# Patient Record
Sex: Male | Born: 1980 | Race: Black or African American | Hispanic: No | Marital: Married | Smoking: Never smoker
Health system: Southern US, Community
[De-identification: ages and names within clinical notes are randomized; demographics above are authoritative.]

## PROBLEM LIST (undated history)

## (undated) DIAGNOSIS — G40909 Epilepsy, unspecified, not intractable, without status epilepticus: Secondary | ICD-10-CM

## (undated) DIAGNOSIS — K56609 Unspecified intestinal obstruction, unspecified as to partial versus complete obstruction: Secondary | ICD-10-CM

## (undated) HISTORY — PX: EXPLORATORY LAPAROTOMY: SUR591

---

## 2016-05-22 DIAGNOSIS — Z0000 Encounter for general adult medical examination without abnormal findings: Secondary | ICD-10-CM | POA: Diagnosis not present

## 2016-05-24 DIAGNOSIS — M25562 Pain in left knee: Secondary | ICD-10-CM | POA: Diagnosis not present

## 2017-03-10 DIAGNOSIS — J208 Acute bronchitis due to other specified organisms: Secondary | ICD-10-CM | POA: Diagnosis not present

## 2017-03-10 DIAGNOSIS — H9201 Otalgia, right ear: Secondary | ICD-10-CM | POA: Diagnosis not present

## 2017-05-29 DIAGNOSIS — Z0000 Encounter for general adult medical examination without abnormal findings: Secondary | ICD-10-CM | POA: Diagnosis not present

## 2017-05-29 DIAGNOSIS — E7800 Pure hypercholesterolemia, unspecified: Secondary | ICD-10-CM | POA: Diagnosis not present

## 2017-05-29 DIAGNOSIS — Z79899 Other long term (current) drug therapy: Secondary | ICD-10-CM | POA: Diagnosis not present

## 2017-05-29 DIAGNOSIS — G40909 Epilepsy, unspecified, not intractable, without status epilepticus: Secondary | ICD-10-CM | POA: Diagnosis not present

## 2018-02-10 DIAGNOSIS — L821 Other seborrheic keratosis: Secondary | ICD-10-CM | POA: Diagnosis not present

## 2018-02-10 DIAGNOSIS — D239 Other benign neoplasm of skin, unspecified: Secondary | ICD-10-CM | POA: Diagnosis not present

## 2018-02-10 DIAGNOSIS — L309 Dermatitis, unspecified: Secondary | ICD-10-CM | POA: Diagnosis not present

## 2018-02-10 DIAGNOSIS — L739 Follicular disorder, unspecified: Secondary | ICD-10-CM | POA: Diagnosis not present

## 2018-03-30 DIAGNOSIS — L309 Dermatitis, unspecified: Secondary | ICD-10-CM | POA: Diagnosis not present

## 2018-06-02 DIAGNOSIS — L739 Follicular disorder, unspecified: Secondary | ICD-10-CM | POA: Diagnosis not present

## 2018-06-04 DIAGNOSIS — Z0000 Encounter for general adult medical examination without abnormal findings: Secondary | ICD-10-CM | POA: Diagnosis not present

## 2018-06-04 DIAGNOSIS — E7800 Pure hypercholesterolemia, unspecified: Secondary | ICD-10-CM | POA: Diagnosis not present

## 2018-06-04 DIAGNOSIS — G40909 Epilepsy, unspecified, not intractable, without status epilepticus: Secondary | ICD-10-CM | POA: Diagnosis not present

## 2018-06-04 DIAGNOSIS — Z5181 Encounter for therapeutic drug level monitoring: Secondary | ICD-10-CM | POA: Diagnosis not present

## 2018-12-07 ENCOUNTER — Other Ambulatory Visit: Payer: Self-pay

## 2018-12-07 DIAGNOSIS — Z20822 Contact with and (suspected) exposure to covid-19: Secondary | ICD-10-CM

## 2018-12-09 LAB — NOVEL CORONAVIRUS, NAA: SARS-CoV-2, NAA: NOT DETECTED

## 2019-02-16 ENCOUNTER — Other Ambulatory Visit: Payer: Self-pay

## 2019-02-16 DIAGNOSIS — Z20822 Contact with and (suspected) exposure to covid-19: Secondary | ICD-10-CM

## 2019-02-18 LAB — NOVEL CORONAVIRUS, NAA: SARS-CoV-2, NAA: NOT DETECTED

## 2019-03-09 ENCOUNTER — Other Ambulatory Visit: Payer: Self-pay

## 2019-03-09 DIAGNOSIS — Z20822 Contact with and (suspected) exposure to covid-19: Secondary | ICD-10-CM

## 2019-03-10 LAB — NOVEL CORONAVIRUS, NAA: SARS-CoV-2, NAA: NOT DETECTED

## 2019-05-27 DIAGNOSIS — Z5181 Encounter for therapeutic drug level monitoring: Secondary | ICD-10-CM | POA: Diagnosis not present

## 2019-05-27 DIAGNOSIS — E7800 Pure hypercholesterolemia, unspecified: Secondary | ICD-10-CM | POA: Diagnosis not present

## 2019-05-27 DIAGNOSIS — G40909 Epilepsy, unspecified, not intractable, without status epilepticus: Secondary | ICD-10-CM | POA: Diagnosis not present

## 2019-06-01 ENCOUNTER — Ambulatory Visit: Payer: Self-pay | Attending: Internal Medicine

## 2019-06-01 DIAGNOSIS — Z20822 Contact with and (suspected) exposure to covid-19: Secondary | ICD-10-CM | POA: Insufficient documentation

## 2019-06-02 ENCOUNTER — Other Ambulatory Visit: Payer: Self-pay

## 2019-06-02 LAB — NOVEL CORONAVIRUS, NAA: SARS-CoV-2, NAA: NOT DETECTED

## 2019-06-24 DIAGNOSIS — Z0000 Encounter for general adult medical examination without abnormal findings: Secondary | ICD-10-CM | POA: Diagnosis not present

## 2019-08-26 DIAGNOSIS — M79645 Pain in left finger(s): Secondary | ICD-10-CM | POA: Diagnosis not present

## 2019-08-26 DIAGNOSIS — R030 Elevated blood-pressure reading, without diagnosis of hypertension: Secondary | ICD-10-CM | POA: Diagnosis not present

## 2019-08-26 DIAGNOSIS — E7800 Pure hypercholesterolemia, unspecified: Secondary | ICD-10-CM | POA: Diagnosis not present

## 2019-08-26 DIAGNOSIS — R635 Abnormal weight gain: Secondary | ICD-10-CM | POA: Diagnosis not present

## 2019-11-30 DIAGNOSIS — E7800 Pure hypercholesterolemia, unspecified: Secondary | ICD-10-CM | POA: Diagnosis not present

## 2019-12-03 DIAGNOSIS — E7800 Pure hypercholesterolemia, unspecified: Secondary | ICD-10-CM | POA: Diagnosis not present

## 2019-12-03 DIAGNOSIS — R030 Elevated blood-pressure reading, without diagnosis of hypertension: Secondary | ICD-10-CM | POA: Diagnosis not present

## 2019-12-03 DIAGNOSIS — G40909 Epilepsy, unspecified, not intractable, without status epilepticus: Secondary | ICD-10-CM | POA: Diagnosis not present

## 2019-12-03 DIAGNOSIS — R635 Abnormal weight gain: Secondary | ICD-10-CM | POA: Diagnosis not present

## 2020-04-21 DIAGNOSIS — U071 COVID-19: Secondary | ICD-10-CM | POA: Diagnosis not present

## 2020-04-21 DIAGNOSIS — Z20822 Contact with and (suspected) exposure to covid-19: Secondary | ICD-10-CM | POA: Diagnosis not present

## 2020-05-03 DIAGNOSIS — Z1159 Encounter for screening for other viral diseases: Secondary | ICD-10-CM | POA: Diagnosis not present

## 2020-05-17 DIAGNOSIS — Z1159 Encounter for screening for other viral diseases: Secondary | ICD-10-CM | POA: Diagnosis not present

## 2020-05-18 DIAGNOSIS — Z1159 Encounter for screening for other viral diseases: Secondary | ICD-10-CM | POA: Diagnosis not present

## 2020-05-18 DIAGNOSIS — Z1152 Encounter for screening for COVID-19: Secondary | ICD-10-CM | POA: Diagnosis not present

## 2020-06-15 DIAGNOSIS — Z1159 Encounter for screening for other viral diseases: Secondary | ICD-10-CM | POA: Diagnosis not present

## 2020-07-18 DIAGNOSIS — E7800 Pure hypercholesterolemia, unspecified: Secondary | ICD-10-CM | POA: Diagnosis not present

## 2020-07-18 DIAGNOSIS — Z125 Encounter for screening for malignant neoplasm of prostate: Secondary | ICD-10-CM | POA: Diagnosis not present

## 2020-07-18 DIAGNOSIS — Z5181 Encounter for therapeutic drug level monitoring: Secondary | ICD-10-CM | POA: Diagnosis not present

## 2020-07-24 DIAGNOSIS — Z0000 Encounter for general adult medical examination without abnormal findings: Secondary | ICD-10-CM | POA: Diagnosis not present

## 2020-09-08 DIAGNOSIS — Z5181 Encounter for therapeutic drug level monitoring: Secondary | ICD-10-CM | POA: Diagnosis not present

## 2020-09-23 ENCOUNTER — Other Ambulatory Visit: Payer: Self-pay

## 2020-09-23 ENCOUNTER — Emergency Department (HOSPITAL_COMMUNITY)
Admission: EM | Admit: 2020-09-23 | Discharge: 2020-09-24 | Disposition: A | Discharge status: Home / Self Care | Payer: BC Managed Care – PPO | Attending: Emergency Medicine | Admitting: Emergency Medicine

## 2020-09-23 DIAGNOSIS — S0101XA Laceration without foreign body of scalp, initial encounter: Secondary | ICD-10-CM | POA: Diagnosis not present

## 2020-09-23 DIAGNOSIS — W228XXA Striking against or struck by other objects, initial encounter: Secondary | ICD-10-CM | POA: Diagnosis not present

## 2020-09-23 DIAGNOSIS — S0990XA Unspecified injury of head, initial encounter: Secondary | ICD-10-CM | POA: Diagnosis not present
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
## 2020-09-24 ENCOUNTER — Emergency Department (HOSPITAL_COMMUNITY): Payer: BC Managed Care – PPO

## 2020-09-24 DIAGNOSIS — S0101XA Laceration without foreign body of scalp, initial encounter: Secondary | ICD-10-CM | POA: Diagnosis not present

## 2020-09-24 DIAGNOSIS — S0990XA Unspecified injury of head, initial encounter: Secondary | ICD-10-CM | POA: Diagnosis not present

## 2020-09-24 MED ORDER — LIDOCAINE-EPINEPHRINE (PF) 2 %-1:200000 IJ SOLN
10.0000 mL | Freq: Once | INTRAMUSCULAR | Status: AC
  Administered 2020-09-24: 10 mL
  Filled 2020-09-24: qty 20

## 2020-09-24 NOTE — ED Notes (Signed)
Suture cart at bedside 

## 2020-12-27 DIAGNOSIS — L040 Acute lymphadenitis of face, head and neck: Secondary | ICD-10-CM | POA: Diagnosis not present

## 2021-02-09 DIAGNOSIS — E7800 Pure hypercholesterolemia, unspecified: Secondary | ICD-10-CM | POA: Diagnosis not present

## 2021-02-26 DIAGNOSIS — Z5181 Encounter for therapeutic drug level monitoring: Secondary | ICD-10-CM | POA: Diagnosis not present

## 2021-02-26 DIAGNOSIS — R899 Unspecified abnormal finding in specimens from other organs, systems and tissues: Secondary | ICD-10-CM | POA: Diagnosis not present

## 2021-02-26 DIAGNOSIS — E7800 Pure hypercholesterolemia, unspecified: Secondary | ICD-10-CM | POA: Diagnosis not present

## 2021-02-26 DIAGNOSIS — G40909 Epilepsy, unspecified, not intractable, without status epilepticus: Secondary | ICD-10-CM | POA: Diagnosis not present

## 2021-04-04 DIAGNOSIS — R1032 Left lower quadrant pain: Secondary | ICD-10-CM | POA: Diagnosis not present

## 2021-05-08 DIAGNOSIS — B353 Tinea pedis: Secondary | ICD-10-CM | POA: Diagnosis not present

## 2021-05-30 DIAGNOSIS — Z412 Encounter for routine and ritual male circumcision: Secondary | ICD-10-CM | POA: Diagnosis not present

## 2021-06-19 ENCOUNTER — Other Ambulatory Visit: Payer: Self-pay

## 2021-06-19 ENCOUNTER — Observation Stay (HOSPITAL_COMMUNITY): Payer: BC Managed Care – PPO

## 2021-06-19 ENCOUNTER — Encounter (HOSPITAL_COMMUNITY): Payer: Self-pay

## 2021-06-19 ENCOUNTER — Inpatient Hospital Stay (HOSPITAL_COMMUNITY)
Admission: EM | Admit: 2021-06-19 | Discharge: 2021-06-21 | DRG: 375 | Disposition: A | Discharge status: Home / Self Care | Payer: BC Managed Care – PPO | Attending: Internal Medicine | Admitting: Internal Medicine

## 2021-06-19 ENCOUNTER — Emergency Department (HOSPITAL_COMMUNITY): Payer: BC Managed Care – PPO

## 2021-06-19 DIAGNOSIS — R1110 Vomiting, unspecified: Secondary | ICD-10-CM | POA: Diagnosis not present

## 2021-06-19 DIAGNOSIS — G40909 Epilepsy, unspecified, not intractable, without status epilepticus: Secondary | ICD-10-CM | POA: Diagnosis not present

## 2021-06-19 DIAGNOSIS — R935 Abnormal findings on diagnostic imaging of other abdominal regions, including retroperitoneum: Secondary | ICD-10-CM | POA: Diagnosis present

## 2021-06-19 DIAGNOSIS — E871 Hypo-osmolality and hyponatremia: Secondary | ICD-10-CM | POA: Diagnosis not present

## 2021-06-19 DIAGNOSIS — R109 Unspecified abdominal pain: Secondary | ICD-10-CM | POA: Diagnosis not present

## 2021-06-19 DIAGNOSIS — Z8249 Family history of ischemic heart disease and other diseases of the circulatory system: Secondary | ICD-10-CM | POA: Diagnosis not present

## 2021-06-19 DIAGNOSIS — Z6830 Body mass index (BMI) 30.0-30.9, adult: Secondary | ICD-10-CM | POA: Diagnosis not present

## 2021-06-19 DIAGNOSIS — K5669 Other partial intestinal obstruction: Secondary | ICD-10-CM | POA: Diagnosis not present

## 2021-06-19 DIAGNOSIS — E860 Dehydration: Secondary | ICD-10-CM | POA: Diagnosis not present

## 2021-06-19 DIAGNOSIS — E669 Obesity, unspecified: Secondary | ICD-10-CM | POA: Diagnosis not present

## 2021-06-19 DIAGNOSIS — R739 Hyperglycemia, unspecified: Secondary | ICD-10-CM | POA: Diagnosis not present

## 2021-06-19 DIAGNOSIS — R1013 Epigastric pain: Secondary | ICD-10-CM | POA: Diagnosis not present

## 2021-06-19 DIAGNOSIS — Z803 Family history of malignant neoplasm of breast: Secondary | ICD-10-CM | POA: Diagnosis not present

## 2021-06-19 DIAGNOSIS — K56609 Unspecified intestinal obstruction, unspecified as to partial versus complete obstruction: Secondary | ICD-10-CM | POA: Diagnosis present

## 2021-06-19 DIAGNOSIS — C786 Secondary malignant neoplasm of retroperitoneum and peritoneum: Principal | ICD-10-CM | POA: Diagnosis present

## 2021-06-19 DIAGNOSIS — K6389 Other specified diseases of intestine: Secondary | ICD-10-CM | POA: Diagnosis not present

## 2021-06-19 DIAGNOSIS — Z20822 Contact with and (suspected) exposure to covid-19: Secondary | ICD-10-CM | POA: Diagnosis not present

## 2021-06-19 DIAGNOSIS — R918 Other nonspecific abnormal finding of lung field: Secondary | ICD-10-CM | POA: Diagnosis not present

## 2021-06-19 HISTORY — DX: Epilepsy, unspecified, not intractable, without status epilepticus: G40.909

## 2021-06-19 HISTORY — DX: Unspecified intestinal obstruction, unspecified as to partial versus complete obstruction: K56.609

## 2021-06-19 LAB — CBC WITH DIFFERENTIAL/PLATELET
Abs Immature Granulocytes: 0.02 10*3/uL (ref 0.00–0.07)
Basophils Absolute: 0 10*3/uL (ref 0.0–0.1)
Basophils Relative: 0 %
Eosinophils Absolute: 0.1 10*3/uL (ref 0.0–0.5)
Eosinophils Relative: 1 %
HCT: 42.3 % (ref 39.0–52.0)
Hemoglobin: 14.1 g/dL (ref 13.0–17.0)
Immature Granulocytes: 0 %
Lymphocytes Relative: 28 %
Lymphs Abs: 2.3 10*3/uL (ref 0.7–4.0)
MCH: 27.8 pg (ref 26.0–34.0)
MCHC: 33.3 g/dL (ref 30.0–36.0)
MCV: 83.3 fL (ref 80.0–100.0)
Monocytes Absolute: 0.5 10*3/uL (ref 0.1–1.0)
Monocytes Relative: 7 %
Neutro Abs: 5.3 10*3/uL (ref 1.7–7.7)
Neutrophils Relative %: 64 %
Platelets: 260 10*3/uL (ref 150–400)
RBC: 5.08 MIL/uL (ref 4.22–5.81)
RDW: 13.1 % (ref 11.5–15.5)
WBC: 8.3 10*3/uL (ref 4.0–10.5)
nRBC: 0 % (ref 0.0–0.2)

## 2021-06-19 LAB — COMPREHENSIVE METABOLIC PANEL
ALT: 32 U/L (ref 0–44)
AST: 39 U/L (ref 15–41)
Albumin: 4.5 g/dL (ref 3.5–5.0)
Alkaline Phosphatase: 120 U/L (ref 38–126)
Anion gap: 12 (ref 5–15)
BUN: 12 mg/dL (ref 6–20)
CO2: 26 mmol/L (ref 22–32)
Calcium: 9.7 mg/dL (ref 8.9–10.3)
Chloride: 96 mmol/L — ABNORMAL LOW (ref 98–111)
Creatinine, Ser: 1.17 mg/dL (ref 0.61–1.24)
GFR, Estimated: >60 mL/min (ref >60)
Glucose, Bld: 165 mg/dL — ABNORMAL HIGH (ref 70–99)
Potassium: 4.1 mmol/L (ref 3.5–5.1)
Sodium: 134 mmol/L — ABNORMAL LOW (ref 135–145)
Total Bilirubin: 0.5 mg/dL (ref 0.3–1.2)
Total Protein: 8.8 g/dL — ABNORMAL HIGH (ref 6.5–8.1)

## 2021-06-19 LAB — URINALYSIS, ROUTINE W REFLEX MICROSCOPIC
Glucose, UA: NEGATIVE mg/dL
Hgb urine dipstick: NEGATIVE
Ketones, ur: 15 mg/dL — AB
Leukocytes,Ua: NEGATIVE
Nitrite: NEGATIVE
Protein, ur: 100 mg/dL — AB
Specific Gravity, Urine: 1.01 (ref 1.005–1.030)
pH: 6.5 (ref 5.0–8.0)

## 2021-06-19 LAB — URINALYSIS, MICROSCOPIC (REFLEX)
Bacteria, UA: NONE SEEN
RBC / HPF: NONE SEEN RBC/hpf (ref 0–5)
Squamous Epithelial / HPF: NONE SEEN (ref 0–5)

## 2021-06-19 LAB — RESP PANEL BY RT-PCR (FLU A&B, COVID) ARPGX2
Influenza A by PCR: NEGATIVE
Influenza B by PCR: NEGATIVE
SARS Coronavirus 2 by RT PCR: NEGATIVE

## 2021-06-19 LAB — LIPASE, BLOOD: Lipase: 42 U/L (ref 11–51)

## 2021-06-19 MED ORDER — DIATRIZOATE MEGLUMINE & SODIUM 66-10 % PO SOLN
90.0000 mL | Freq: Once | ORAL | Status: AC
  Administered 2021-06-19: 90 mL via ORAL
  Filled 2021-06-19: qty 90

## 2021-06-19 MED ORDER — SODIUM CHLORIDE 0.9 % IV SOLN: INTRAVENOUS | Status: DC

## 2021-06-19 MED ORDER — ACETAMINOPHEN 325 MG PO TABS: 650.0000 mg | ORAL_TABLET | Freq: Four times a day (QID) | ORAL | Status: DC | PRN

## 2021-06-19 MED ORDER — ENOXAPARIN SODIUM 40 MG/0.4ML IJ SOSY
40.0000 mg | PREFILLED_SYRINGE | Freq: Every day | INTRAMUSCULAR | Status: DC
  Administered 2021-06-19 – 2021-06-21 (×3): 40 mg via SUBCUTANEOUS
  Filled 2021-06-19 (×3): qty 0.4

## 2021-06-19 MED ORDER — IOHEXOL 300 MG/ML  SOLN
100.0000 mL | Freq: Once | INTRAMUSCULAR | Status: AC | PRN
  Administered 2021-06-19: 100 mL via INTRAVENOUS

## 2021-06-19 MED ORDER — ALBUTEROL SULFATE (2.5 MG/3ML) 0.083% IN NEBU: 2.5000 mg | INHALATION_SOLUTION | Freq: Four times a day (QID) | RESPIRATORY_TRACT | Status: DC | PRN

## 2021-06-19 MED ORDER — ONDANSETRON HCL 4 MG/2ML IJ SOLN: 4.0000 mg | Freq: Four times a day (QID) | INTRAMUSCULAR | Status: DC | PRN

## 2021-06-19 MED ORDER — SODIUM CHLORIDE 0.9 % IV BOLUS
1000.0000 mL | Freq: Once | INTRAVENOUS | Status: AC
  Administered 2021-06-19: 1000 mL via INTRAVENOUS

## 2021-06-19 MED ORDER — CARBAMAZEPINE ER 400 MG PO TB12
400.0000 mg | ORAL_TABLET | Freq: Two times a day (BID) | ORAL | Status: DC
  Administered 2021-06-20 – 2021-06-21 (×3): 400 mg via ORAL
  Filled 2021-06-19 (×3): qty 1

## 2021-06-19 MED ORDER — CARBAMAZEPINE ER 200 MG PO TB12: 400.0000 mg | ORAL_TABLET | Freq: Two times a day (BID) | ORAL | Status: DC

## 2021-06-19 MED ORDER — ACETAMINOPHEN 650 MG RE SUPP: 650.0000 mg | Freq: Four times a day (QID) | RECTAL | Status: DC | PRN

## 2021-06-19 MED ORDER — MORPHINE SULFATE (PF) 2 MG/ML IV SOLN: 2.0000 mg | INTRAVENOUS | Status: DC | PRN

## 2021-06-19 MED ORDER — ONDANSETRON HCL 4 MG/2ML IJ SOLN
4.0000 mg | Freq: Once | INTRAMUSCULAR | Status: AC
  Administered 2021-06-19: 4 mg via INTRAVENOUS
  Filled 2021-06-19: qty 2

## 2021-06-19 MED ORDER — PANTOPRAZOLE SODIUM 40 MG IV SOLR
40.0000 mg | INTRAVENOUS | Status: DC
  Administered 2021-06-20: 40 mg via INTRAVENOUS
  Filled 2021-06-19: qty 10

## 2021-06-19 MED ORDER — SODIUM CHLORIDE 0.9% FLUSH
3.0000 mL | Freq: Two times a day (BID) | INTRAVENOUS | Status: DC
  Administered 2021-06-19 – 2021-06-21 (×2): 3 mL via INTRAVENOUS

## 2021-06-19 MED ORDER — ONDANSETRON HCL 4 MG PO TABS: 4.0000 mg | ORAL_TABLET | Freq: Four times a day (QID) | ORAL | Status: DC | PRN

## 2021-06-19 NOTE — H&P (Signed)
History and Physical    Patient: Kyle Sharp LZJ:[id number] DOB: [birth year] DOA: 06/19/2021 DOS: the patient was seen and examined on 06/19/2021 PCP: Vernie Shanks, MD  Patient coming from: Home  Chief Complaint:  Chief Complaint  Patient presents with   Abdominal Pain    HPI: Kyle Sharp is a 41 y.o. male with medical history significant of seizure disorder and SBO s/p exploratory surgeries at the age 25 and 59 presents with complaints of left upper quadrant abdominal pain which started yesterday afternoon.  Pain seemed to radiate to left lower abdomen on the left side.  Pain initially came in waves, but became constant and more severe last night and immediately came to the emergency department.  Patient has daily had similar pain over the last year in December after which he took stool softeners and symptoms seem to improve.  He reported having some kink in his bowels as a kid that he had to have surgery at the age of 1.  He reported having several bowel obstructions where he had to come into the hospital frequently and have NG tube placed  until the obstruction would resolve.  This occurred until age of around 71  where he had his second abdominal surgery which seem to clear up the issue until here recently.  Of note he did have a colonoscopy around the age of 60 for bleeding from, but it was thought to do something with patient having constipation.  He denies having any recent fevers, weight loss, or chest pain.  In the ED patient was noted to be afebrile blood pressure was elevated up to 142/109, and all other vital signs maintained.  Labs noted sodium 134, chloride 96, and glucose 165.  While in triage patient had 1 episode of vomiting.  CT scan of the abdomen pelvis noted multiple well-defined collections of loculated fluid or soft tissue throughout the peritoneal cavity of unclear pathology and small bowel obstruction with transition point in the region of the jejunum.  Urinalysis noted  protein and ketones without significant signs of infection.  General surgery have been consulted.  Patient was given 1 L normal saline IV fluids and Zofran.  No NG tube was initially placed as patient was actively vomiting.  Review of Systems: As mentioned in the history of present illness. All other systems reviewed and are negative. Past Medical History:  Diagnosis Date   Seizure disorder [facility])    Past Surgical History:  Procedure Laterality Date   EXPLORATORY LAPAROTOMY     At the age of 1 and at the age of 28   Social History:  reports that he has never smoked. He has never used smokeless tobacco. He reports current alcohol use. No history on file for drug use.  No Known Allergies  Family History  Problem Relation Age of Onset   Heart failure Mother    Breast cancer Paternal Aunt    Breast cancer Paternal Aunt     Prior to Admission medications   Not on File    Physical Exam: Vitals:   06/19/21 0729 06/19/21 0937 06/19/21 0947 06/19/21 1030  BP: (!) 142/109 (!) 136/93  125/75  Pulse: 94 83  85  Resp: 17 16  15   Temp: 98.2 F (36.8 C)  99 F (37.2 C)   TempSrc: Oral Oral Oral   SpO2: 99% 99%  99%    Constitutional: Middle-age male currently in NAD, calm, comfortable Eyes: PERRL, lids and conjunctivae normal ENMT: Mucous membranes are moist. Posterior  pharynx clear of any exudate or lesions.  Neck: normal, supple, no masses, no thyromegaly Respiratory: clear to auscultation bilaterally, no wheezing, no crackles. Normal respiratory effort. No accessory muscle use.  Cardiovascular: Regular rate and rhythm, no murmurs / rubs / gallops. No extremity edema. 2+ pedal pulses. No carotid bruits.  Abdomen: Midline scar present with bowel sounds present in all 4 quadrants.  Mild tenderness noted of the left upper quadrant. Musculoskeletal: no clubbing / cyanosis. Normal muscle tone.  Skin: no rashes, lesions, ulcers. No induration Neurologic: CN 2-12 grossly intact. Strength  5/5 in all 4.  Psychiatric: Normal judgment and insight. Alert and oriented x 3. Normal mood.    Data Reviewed: Labs and imaging reviewed.  Assessment and Plan: * SBO (small bowel obstruction) ([place])- (present on admission) Patient presents with complaints of abdominal pain.  Prior history of surgeries CT scan concerning for small bowel obstruction with transition point in the region of the distal jejunum. -Admit to a MedSurg bed -Small bowel protocol  -N.p.o. -Normal saline IV fluids at 75 mL/h -Protonix IV -Morphine IV as needed for pain -Antiemetics as needed -Follow-up repeat abdominal x-ray -Appreciate general surgery consultative services.  Abnormal CT of the abdomen- (present on admission) Acute.  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 -General surgery recommended IR consultation for biopsy -Consider discussing with oncology in regards to other diagnostic test that should be ordered in a.m.  Seizure disorder [facility]) Patient with a prior history of seizure disorder.  Last seizure noted to be back in 2014.  Currently on Tegretol extended release 400 mg twice daily. -Continue Tegretol.  Patient to use home medication  Hyponatremia Acute.  Sodium is mildly low at 134.  Likely related with recent episode of vomiting. -IV fluids as noted above -Recheck sodium in am   Hyperglycemia Acute.  Glucose 165 on admission.  No prior history of diabetes.  Could be secondary to acute stress -Continue to monitor and consider need to check hemoglobin A1c       Advance Care Planning:   Code Status: Full Code   Consults: General surgery  Family Communication: Discussed plan of care with the patient's wife  Severity of Illness: The appropriate patient status for this patient is INPATIENT. Inpatient  status is judged to be reasonable and necessary in order to provide the required intensity of service to ensure the patient's safety. The patient's presenting symptoms, physical exam findings, and initial radiographic and laboratory data in the context of their chronic comorbidities is felt to place them at high risk for further clinical deterioration. Furthermore, it is not anticipated that the patient will be medically stable for discharge from the hospital within 2 midnights of admission.   * I certify that at the point of admission it is my clinical judgment that the patient will require inpatient hospital care spanning beyond 2 midnights from the point of admission due to high intensity of service, high risk for further deterioration and high frequency of surveillance required.*  Author: Norval Morton, MD 06/19/2021 10:56 AM  For on call review [email or web address].

## 2021-06-19 NOTE — Assessment & Plan Note (Addendum)
Patient with previous history of abdominal surgeries and small bowel obstruction.  General surgery was consulted.  Seems to be improving.  He has had multiple bowel movements.  Imaging study showed that contrast was noted to be in the colon.  Diet was advanced gradually.  Patient did well without any recurrence of nausea or vomiting.  Has been ambulating without difficulty.  Cleared by general surgery for discharge.

## 2021-06-19 NOTE — Consult Note (Signed)
Consult/Admission Note  Kyle Sharp [birth year]  [id number].    Requesting MD: Isla Pence, MD Chief Complaint/Reason for Consult: SBO possible peritoneal carcinomatosis  HPI:  Patient is a 41 year old male who presented to the ED with intermittent abdominal pain dating back to December 2022.  This episode of pain started yesterday at 3 PM.  Pain is located in the left upper quadrant and radiates down his left side. The patient states this is typical of his abdominal pain and is where it always hurts, however the pain usually goes away on its own after he has a bowel movement or passes gas.  Reports several BMs yesterday, not dark or bloody.  States his last episode of flatus was yesterday.  Reports 1 episode of nonbloody vomiting in the emergency department.  Denies fever, chills, urinary complaints, chest pain, SOB.  He reports a history of constipation that started in adulthood-for this he usually takes MiraLAX, prune juice, occasionally magnesium citrate.  No known hx of malignancy -states he had a colonoscopy in [place] around the age of 46 or 36 due to GI bleeding that was negative.  Has not had a colonoscopy since that time.  Denies recent weight loss.  Past abdominal surgeries include surgery for a bowel obstruction when he was 55-1/42 years old, followed by exploratory laparotomy when he was 14 due to recurrent small bowel obstructions.  Patient reports a family history of breast cancer but denies a known family history of colon cancer.  PMH otherwise significant for epilepsy with last seizure in 2014. Surgical history as stated above. NKDA.  Denies tobacco or drug use.  Reports occasional, but not daily, alcohol use.  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  ROS: As above Review of Systems  All other systems reviewed and are negative.  No family history on file.  History reviewed. No pertinent past medical  history.  History reviewed. No pertinent surgical history.  Social History:  has no history on file for tobacco use, alcohol use, and drug use.  Allergies: No Known Allergies  (Not in a hospital admission)   Blood pressure (!) 136/93, pulse 83, temperature 99 F (37.2 C), temperature source Oral, resp. rate 16, SpO2 99 %. Physical Exam:  General: pleasant, WD,  male who is laying in bed in NAD HEENT: head is normocephalic, atraumatic.  Sclera are noninjected.  PERRL.  Ears and nose without any masses or lesions.  Mouth is pink and moist Heart: regular, rate, and rhythm.  Normal s1,s2. No obvious murmurs, gallops, or rubs noted.  Palpable radial and pedal pulses bilaterally Lungs: CTAB, no wheezes, rhonchi, or rales noted.  Respiratory effort nonlabored Abd: soft, nontender, not significantly distended, +BS, no masses, hernias, or organomegaly; previous midline laparotomy scar appears well-healing, previous transverse abdominal incision from surgery as a child; there are no palpable masses or lymph nodes on abdominal exam MS: all 4 extremities are symmetrical with no cyanosis, clubbing, or edema. Skin: warm and dry with no masses, lesions, or rashes Neuro: Cranial nerves 2-12 grossly intact, sensation is normal throughout Psych: A&Ox3 with an appropriate affect.   Results for orders placed or performed during the hospital encounter of 06/19/21 (from the past 48 hour(s))  CBC with Differential     Status: None   Collection Time: 06/19/21  2:34 AM  Result Value Ref Range   WBC 8.3 4.0 - 10.5 K/uL  RBC 5.08 4.22 - 5.81 MIL/uL   Hemoglobin 14.1 13.0 - 17.0 g/dL   HCT 42.3 39.0 - 52.0 %   MCV 83.3 80.0 - 100.0 fL   MCH 27.8 26.0 - 34.0 pg   MCHC 33.3 30.0 - 36.0 g/dL   RDW 13.1 11.5 - 15.5 %   Platelets 260 150 - 400 K/uL   nRBC 0.0 0.0 - 0.2 %   Neutrophils Relative % 64 %   Neutro Abs 5.3 1.7 - 7.7 K/uL   Lymphocytes Relative 28 %   Lymphs Abs 2.3 0.7 - 4.0 K/uL   Monocytes  Relative 7 %   Monocytes Absolute 0.5 0.1 - 1.0 K/uL   Eosinophils Relative 1 %   Eosinophils Absolute 0.1 0.0 - 0.5 K/uL   Basophils Relative 0 %   Basophils Absolute 0.0 0.0 - 0.1 K/uL   Immature Granulocytes 0 %   Abs Immature Granulocytes 0.02 0.00 - 0.07 K/uL    Comment: Performed at [facility] [address] [place], [place] [address]  Comprehensive metabolic panel     Status: Abnormal   Collection Time: 06/19/21  2:34 AM  Result Value Ref Range   Sodium 134 (L) 135 - 145 mmol/L   Potassium 4.1 3.5 - 5.1 mmol/L   Chloride 96 (L) 98 - 111 mmol/L   CO2 26 22 - 32 mmol/L   Glucose, Bld 165 (H) 70 - 99 mg/dL    Comment: Glucose reference range applies only to samples taken after fasting for at least 8 hours.   BUN 12 6 - 20 mg/dL   Creatinine, Ser 1.17 0.61 - 1.24 mg/dL   Calcium 9.7 8.9 - 10.3 mg/dL   Total Protein 8.8 (H) 6.5 - 8.1 g/dL   Albumin 4.5 3.5 - 5.0 g/dL   AST 39 15 - 41 U/L   ALT 32 0 - 44 U/L   Alkaline Phosphatase 120 38 - 126 U/L   Total Bilirubin 0.5 0.3 - 1.2 mg/dL   GFR, Estimated >60 >60 mL/min    Comment: (NOTE) Calculated using the CKD-EPI Creatinine Equation (2021)    Anion gap 12 5 - 15    Comment: Performed at [facility] [address], [place], [place] [address]  Lipase, blood     Status: None   Collection Time: 06/19/21  2:34 AM  Result Value Ref Range   Lipase 42 11 - 51 U/L    Comment: Performed at [facility] [address] [place], [place] [address]  Urinalysis, Routine w reflex microscopic     Status: Abnormal   Collection Time: 06/19/21  9:18 AM  Result Value Ref Range   Color, Urine AMBER (A) YELLOW    Comment: BIOCHEMICALS MAY BE AFFECTED BY COLOR   APPearance CLEAR CLEAR   Specific Gravity, Urine 1.010 1.005 - 1.030   pH 6.5 5.0 - 8.0   Glucose, UA NEGATIVE NEGATIVE mg/dL   Hgb urine dipstick NEGATIVE NEGATIVE   Bilirubin Urine SMALL (A) NEGATIVE   Ketones, ur 15 (A) NEGATIVE mg/dL   Protein, ur 100  (A) NEGATIVE mg/dL   Nitrite NEGATIVE NEGATIVE   Leukocytes,Ua NEGATIVE NEGATIVE    Comment: Performed at [facility] [address], [place], [place] [address]  Urinalysis, Microscopic (reflex)     Status: None   Collection Time: 06/19/21  9:18 AM  Result Value Ref Range   RBC / HPF NONE SEEN 0 - 5 RBC/hpf   WBC,  UA 0-5 0 - 5 WBC/hpf   Bacteria, UA NONE SEEN NONE SEEN   Squamous Epithelial / LPF NONE SEEN 0 - 5    Comment: Performed at [facility], [place] [address], [place], [place] [address]   CT ABDOMEN PELVIS W CONTRAST  Result Date: 06/19/2021 CLINICAL DATA:  Nausea vomiting with abdominal pain. EXAM: CT ABDOMEN AND PELVIS WITH CONTRAST TECHNIQUE: Multidetector CT imaging of the abdomen and pelvis was performed using the standard protocol following bolus administration of intravenous contrast. RADIATION DOSE REDUCTION: This exam was performed according to the departmental dose-optimization program which includes automated exposure control, adjustment of the mA and/or kV according to patient size and/or use of iterative reconstruction technique. CONTRAST:  146mL OMNIPAQUE IOHEXOL 300 MG/ML  SOLN COMPARISON:  None. FINDINGS: Lower chest: Unremarkable. Hepatobiliary: No suspicious focal abnormality within the liver parenchyma. There is no evidence for gallstones, gallbladder wall thickening, or pericholecystic fluid. No intrahepatic or extrahepatic biliary dilation. Pancreas: No focal mass lesion. No dilatation of the main duct. No intraparenchymal cyst. No peripancreatic edema. Spleen: No splenomegaly. No focal mass lesion. Adrenals/Urinary Tract: No adrenal nodule or mass. Kidneys unremarkable. No evidence for hydroureter. The urinary bladder appears normal for the degree of distention. Stomach/Bowel: Stomach is unremarkable. No gastric wall thickening. No evidence of outlet obstruction. Duodenum is normally positioned as is the ligament of Treitz. Small bowel in the left upper  quadrant and central abdomen is dilated and fluid-filled measuring up to 4.8 cm diameter. Fecalization of enteric contents noted in the left abdominal loop (axial image 49/3) with rapid tapering into a nondilated loop (axial 47/3) this appears to be in the region of the distal jejunum. Ileal loops are decompressed. Terminal ileum unremarkable. The appendix is normal. Colon is diffusely decompressed. Vascular/Lymphatic: No abdominal aortic aneurysm. No abdominal aortic atherosclerotic calcification. There is no gastrohepatic or hepatoduodenal ligament lymphadenopathy. No retroperitoneal or mesenteric lymphadenopathy. No pelvic sidewall lymphadenopathy. Reproductive: The prostate gland and seminal vesicles are unremarkable. Other: Multiple well-defined homogeneous collections of loculated fluid or low-density soft tissue are identified throughout the peritoneal cavity. 4.2 x 3.1 cm lesion identified in the lesser sac on image 19/series 3. 4.7 x 1.7 cm collection identified anterior to the liver on image 11/series 3. 3.7 x 1.8 cm collection identified anterior to the inferior vena cava on 43/3. Several omental collections are evident. Material is identified in the left paracolic gutter (images 62-83 of series 3. Small volume free fluid noted in the posterior pelvis. Musculoskeletal: No worrisome lytic or sclerotic osseous abnormality. IMPRESSION: 1. Multiple well-defined homogeneous collections of loculated fluid or low-density soft tissue throughout the peritoneal cavity and in the anterior right juxta. Imaging features are nonspecific but raise the question of peritoneal carcinomatosis. PET-CT may prove helpful to further evaluate. Ultimately, tissue sampling likely warranted. No appendix is visible on the study in the patient may be status post appendectomy. If so, correlation with pathology report from appendectomy suggested. 2. Small bowel obstruction with transition point likely in the region of the distal  jejunum. 3. Small volume free fluid in the posterior pelvis. Electronically Signed   By: Misty Stanley M.D.   On: 06/19/2021 06:22    Assessment/Plan SBO Possible peritoneal carcinomatosis - CT 2/28 with well defined collections of loculated fluid of low-density soft tissue throughout peritoneal cavity and in anterior right juxta, question of peritoneal carcinomatosis, SBO w/ transition likely in the distal jejunum  - no leukocytosis, afebrile - abdominal exam benign, no further vomiting, stomach  decompressed on his CT scan - recommend medical admission and PO SBO protocol, would place an NG tube if abdominal distention, vomiting, or abdominal pain recur. - recommend IR consult for biopsy of collections vs soft tissue masses/LN  FEN-NPO, IVF VTE-SCDs, okay for chemical DVT prophylaxis from a surgical perspective ID- afebrile, WBC WNL, there are intra-abdominal fluid collections present but I dont feel strongly that IV abx are currently indicated  Admit to medical service  Hx of epilepsy with last seizure in 2014  I reviewed nursing notes, ED provider notes, hospitalist notes, last 24 h vitals and pain scores, last 48 h intake and output, last 24 h labs and trends, and last 24 h imaging results.  This care required high  level of medical decision making.   Obie Dredge, [facility] 06/19/2021, 10:26 AM Please see Amion for pager number during day hours 7:00am-4:30pm

## 2021-06-19 NOTE — ED Provider Triage Note (Addendum)
Emergency Medicine Provider Triage Evaluation Note  Kyle Sharp , a 41 y.o. male  was evaluated in triage.  Pt complains of epigastric abdominal pain.  Issues of the past few weeks with heartburn and constipation.  Seen at another ED recently and had CT done that was normal aside from constipation, discharged home with MiraLAX.  Has been trying Senokot and other medications at home but cannot have bowel movement.  No vomiting  Review of Systems  Positive: Abdominal pain Negative: Vomiting, fever  Physical Exam  BP (!) 130/96 (BP Location: Right Arm)    Pulse 92    Temp 98.5 F (36.9 C) (Oral)    Resp 20    SpO2 100%   Gen:   Awake, no distress   Resp:  Normal effort  MSK:   Moves extremities without difficulty  Other:  Well healed abdominal surgical scars from childhood, no apparent distention, some mild tenderness left lower  Medical Decision Making  Medically screening exam initiated at 2:38 AM.  Appropriate orders placed.  Silvano Garofano was informed that the remainder of the evaluation will be completed by another provider, this initial triage assessment does not replace that evaluation, and the importance of remaining in the ED until their evaluation is complete.  Abdominal pain.  Recent ED visits with CT showing constipation.  No vomiting.  Labs, acute abd series.  2:48 AM Patient now vomiting-- large volume.  Will get CT scan to r/o obstruction.   Larene Pickett, PA-C 06/19/21 0240    Larene Pickett, PA-C 06/19/21 0242    Larene Pickett, PA-C 06/19/21 [phone]

## 2021-06-19 NOTE — Assessment & Plan Note (Addendum)
Apart from small bowel obstruction there were also findings suggestive and concerning for peritoneal carcinomatosis.  CT abdomen pelvis did not show any mass in the pancreas or in the stomach.  No lesions in the hepatobiliary system also noted. Interventional radiology was consulted.  CT chest was done which did not show any new findings.  Interventional radiology felt that there was no safe way to biopsy these lesions.  Discussed with general surgery who also did not feel that there was any surgical we did biopsy these lesions.   Discussed with medical oncology.  Patient will be scheduled an appointment at their rapid diagnosis clinic.   May need to undergo PET CT scan.  Patient was also requested to obtain old CT imagings done while he was in [place] for comparison.   The plan was discussed with patient and his wife and they agree. Tumor markers including CEA, CA 19-9 and PSA were normal.

## 2021-06-19 NOTE — Assessment & Plan Note (Addendum)
Elevated glucose level of 165 was nonfasting.  Fasting levels have been normal.  No further inpatient work-up is necessary at this time.

## 2021-06-19 NOTE — Progress Notes (Signed)
IR procedure request lymph node biopsy.    History of epilepsy. Presented to the ED at [facility] with abdominal pain X 3 weeks and 1 episode of vomiting. CT Abd pelvis from 2.28.23 reads  Multiple well-defined homogeneous collections of loculated fluid or low-density soft tissue throughout the peritoneal cavity and in the anterior right juxta. Imaging features are nonspecific but raise the question of peritoneal carcinomatosis. PET-CT may prove helpful to further evaluate. Ultimately, tissue sampling likely warranted.   Request discussed with Mir. Recommend management and treatment of emergent bowel obstruction. Once the patient has stabilized obtain enhanced CT chest for further devaluation of possible biopsy sites. Please obtain any outside films for comparison if applicable.  Please re-consult IR after Patient's acute condition is stabilized and enhanced CT Chest obtained.

## 2021-06-19 NOTE — Assessment & Plan Note (Addendum)
Patient with a prior history of seizure disorder.  Last seizure noted to be back in 2014.  Currently on Tegretol extended release 400 mg twice daily.

## 2021-06-19 NOTE — ED Notes (Addendum)
Gastrografin finished by pt. Xray notified.

## 2021-06-19 NOTE — Assessment & Plan Note (Addendum)
Sodium level has improved.

## 2021-06-19 NOTE — ED Triage Notes (Signed)
Pt has been having upper abd pain for the few weeks, has problems in the past with constipation, no n/v

## 2021-06-19 NOTE — ED Provider Notes (Signed)
[facility] Provider Note   CSN: [id number] Arrival date & time: 06/19/21  1275     History  Chief Complaint  Patient presents with   Abdominal Pain    Kyle Sharp is a 41 y.o. male with a history of epilepsy last seizure in 2014 who presents to the ED due to intermittent upper abdominal pain x3 weeks.  Patient evaluated at urgent care 3 weeks ago where a KUB was performed which showed possible constipation per patient.  Patient treated with MiraLAX and omeprazole with no relief.  Patient states pain is located in the epigastric and left side of abdomen.  Patient endorses 1 episode of nonbloody, nonbilious emesis while in the waiting room today.  He endorses multiple bowel movements yesterday without any melena or hematochezia.  He has had multiple abdominal operations when he was 2-1/2 and 14 in [place] however, unsure exactly what was performed.  No fever or chills.  No urinary symptoms.  Denies chest pain and shortness of breath. Denies history of cancer. No recent weight loss.       Home Medications Prior to Admission medications   Not on File      Allergies    Patient has no known allergies.    Review of Systems   Review of Systems  Constitutional:  Negative for chills and fever.  Respiratory:  Negative for shortness of breath.   Cardiovascular:  Negative for chest pain.  Gastrointestinal:  Positive for abdominal pain, nausea and vomiting. Negative for diarrhea.  Genitourinary:  Negative for dysuria.  Musculoskeletal:  Negative for back pain.   Physical Exam Updated Vital Signs BP 125/75    Pulse 85    Temp 99 F (37.2 C) (Oral)    Resp 15    SpO2 99%  Physical Exam Vitals and nursing note reviewed.  Constitutional:      General: He is not in acute distress.    Appearance: He is not ill-appearing.  HENT:     Head: Normocephalic.  Eyes:     Pupils: Pupils are equal, round, and reactive to light.  Cardiovascular:     Rate  and Rhythm: Normal rate and regular rhythm.     Pulses: Normal pulses.     Heart sounds: Normal heart sounds. No murmur heard.   No friction rub. No gallop.  Pulmonary:     Effort: Pulmonary effort is normal.     Breath sounds: Normal breath sounds.  Abdominal:     General: Abdomen is flat. There is no distension.     Palpations: Abdomen is soft.     Tenderness: There is abdominal tenderness. There is no guarding or rebound.     Comments: Mild diffuse tenderness without rebound or guarding. Old surgical scars.   Musculoskeletal:        General: Normal range of motion.     Cervical back: Neck supple.  Skin:    General: Skin is warm and dry.  Neurological:     General: No focal deficit present.     Mental Status: He is alert.  Psychiatric:        Mood and Affect: Mood normal.        Behavior: Behavior normal.    ED Results / Procedures / Treatments   Labs (all labs ordered are listed, but only abnormal results are displayed) Labs Reviewed  COMPREHENSIVE METABOLIC PANEL - Abnormal; Notable for the following components:      Result Value   Sodium 134 (*)  Chloride 96 (*)    Glucose, Bld 165 (*)    Total Protein 8.8 (*)    All other components within normal limits  URINALYSIS, ROUTINE W REFLEX MICROSCOPIC - Abnormal; Notable for the following components:   Color, Urine AMBER (*)    Bilirubin Urine SMALL (*)    Ketones, ur 15 (*)    Protein, ur 100 (*)    All other components within normal limits  RESP PANEL BY RT-PCR (FLU A&B, COVID) ARPGX2  CBC WITH DIFFERENTIAL/PLATELET  LIPASE, BLOOD  URINALYSIS, MICROSCOPIC (REFLEX)    EKG None  Radiology CT ABDOMEN PELVIS W CONTRAST  Result Date: 06/19/2021 CLINICAL DATA:  Nausea vomiting with abdominal pain. EXAM: CT ABDOMEN AND PELVIS WITH CONTRAST TECHNIQUE: Multidetector CT imaging of the abdomen and pelvis was performed using the standard protocol following bolus administration of intravenous contrast. RADIATION DOSE  REDUCTION: This exam was performed according to the departmental dose-optimization program which includes automated exposure control, adjustment of the mA and/or kV according to patient size and/or use of iterative reconstruction technique. CONTRAST:  148mL OMNIPAQUE IOHEXOL 300 MG/ML  SOLN COMPARISON:  None. FINDINGS: Lower chest: Unremarkable. Hepatobiliary: No suspicious focal abnormality within the liver parenchyma. There is no evidence for gallstones, gallbladder wall thickening, or pericholecystic fluid. No intrahepatic or extrahepatic biliary dilation. Pancreas: No focal mass lesion. No dilatation of the main duct. No intraparenchymal cyst. No peripancreatic edema. Spleen: No splenomegaly. No focal mass lesion. Adrenals/Urinary Tract: No adrenal nodule or mass. Kidneys unremarkable. No evidence for hydroureter. The urinary bladder appears normal for the degree of distention. Stomach/Bowel: Stomach is unremarkable. No gastric wall thickening. No evidence of outlet obstruction. Duodenum is normally positioned as is the ligament of Treitz. Small bowel in the left upper quadrant and central abdomen is dilated and fluid-filled measuring up to 4.8 cm diameter. Fecalization of enteric contents noted in the left abdominal loop (axial image 49/3) with rapid tapering into a nondilated loop (axial 47/3) this appears to be in the region of the distal jejunum. Ileal loops are decompressed. Terminal ileum unremarkable. The appendix is normal. Colon is diffusely decompressed. Vascular/Lymphatic: No abdominal aortic aneurysm. No abdominal aortic atherosclerotic calcification. There is no gastrohepatic or hepatoduodenal ligament lymphadenopathy. No retroperitoneal or mesenteric lymphadenopathy. No pelvic sidewall lymphadenopathy. Reproductive: The prostate gland and seminal vesicles are unremarkable. Other: Multiple well-defined homogeneous collections of loculated fluid or low-density soft tissue are identified throughout  the peritoneal cavity. 4.2 x 3.1 cm lesion identified in the lesser sac on image 19/series 3. 4.7 x 1.7 cm collection identified anterior to the liver on image 11/series 3. 3.7 x 1.8 cm collection identified anterior to the inferior vena cava on 43/3. Several omental collections are evident. Material is identified in the left paracolic gutter (images 97-35 of series 3. Small volume free fluid noted in the posterior pelvis. Musculoskeletal: No worrisome lytic or sclerotic osseous abnormality. IMPRESSION: 1. Multiple well-defined homogeneous collections of loculated fluid or low-density soft tissue throughout the peritoneal cavity and in the anterior right juxta. Imaging features are nonspecific but raise the question of peritoneal carcinomatosis. PET-CT may prove helpful to further evaluate. Ultimately, tissue sampling likely warranted. No appendix is visible on the study in the patient may be status post appendectomy. If so, correlation with pathology report from appendectomy suggested. 2. Small bowel obstruction with transition point likely in the region of the distal jejunum. 3. Small volume free fluid in the posterior pelvis. Electronically Signed   By: Verda Cumins.D.  On: 06/19/2021 06:22    Procedures Procedures    Medications Ordered in ED Medications  ondansetron (ZOFRAN) injection 4 mg (4 mg Intravenous Given 06/19/21 0254)  iohexol (OMNIPAQUE) 300 MG/ML solution 100 mL (100 mLs Intravenous Contrast Given 06/19/21 0447)  sodium chloride 0.9 % bolus 1,000 mL (1,000 mLs Intravenous New Bag/Given 06/19/21 1010)    ED Course/ Medical Decision Making/ A&P                           Medical Decision Making Amount and/or Complexity of Data Reviewed Independent Historian: spouse    Details: wife at bedside provided some history Labs: ordered. Decision-making details documented in ED Course. Radiology: ordered and independent interpretation performed. Decision-making details documented in ED  Course.  Risk Decision regarding hospitalization.   41 year old male presents to the ED due to intermittent abdominal pain x3 weeks.  Patient has had numerous abdominal operations when he was a child in [place]; however, unsure exactly what was performed.  Patient endorses 1 episode of nonbloody, nonbilious emesis while in the waiting room.  Patient evaluated at urgent care 3 weeks ago and diagnosed with constipation which she has been taking MiraLAX and omeprazole with no relief.  Upon arrival, stable vitals.  Patient in no acute distress.  Unfortunately, patient waited over 7 hours prior to my initial evaluation due to long wait times.  Abdomen soft, nondistended with diffuse tenderness.  No rebound or guarding.  Routine labs and CT abdomen ordered at triage.  Patient declined pain medication at this time. IVFs given.  CBC unremarkable.  No leukocytosis and normal hemoglobin.  Lipase normal at 41.  Doubt pancreatitis.  CMP significant for mild hyponatremia 134.  Hyperglycemia 165 without evidence of DKA.  UA significant for small amount of bilirubin, ketonuria, and proteinuria likely due to dehydration. CT abdomen personally visualized which demonstrates multiple well-defined collections of loculated fluid which raises question for peritoneal carcinomatosis. It also shows a small bowel obstruction. Patient not actively vomiting during intial evaluation. Will hold off on NG tube at this time. Will discuss CT results with surgery. Patient will require admission for bowel obstruction. COVID test ordered.   10:24 AM Discussed with surgery PA who will evaluate patient at bedside.   10:57 AM Discussed with Dr. Tamala Julian with [facility] who agrees to admit patient.   Co morbidities that complicate the patient evaluation  Numerous previous abdominal operations   Critical Interventions:  IVFs, held off on NG given no active vomiting  Reevaluation:  After the interventions noted above, I reevaluated the  patient and found that they have :stayed the same         Final Clinical Impression(s) / ED Diagnoses Final diagnoses:  Small bowel obstruction [facility])    Rx / DC Orders ED Discharge Orders     None         Karie Kirks 06/19/21 1101    Isla Pence, MD 06/19/21 1443

## 2021-06-20 ENCOUNTER — Observation Stay (HOSPITAL_COMMUNITY): Payer: BC Managed Care – PPO

## 2021-06-20 DIAGNOSIS — R935 Abnormal findings on diagnostic imaging of other abdominal regions, including retroperitoneum: Secondary | ICD-10-CM | POA: Diagnosis not present

## 2021-06-20 DIAGNOSIS — K56609 Unspecified intestinal obstruction, unspecified as to partial versus complete obstruction: Secondary | ICD-10-CM | POA: Diagnosis not present

## 2021-06-20 DIAGNOSIS — G40909 Epilepsy, unspecified, not intractable, without status epilepticus: Secondary | ICD-10-CM | POA: Diagnosis not present

## 2021-06-20 DIAGNOSIS — R918 Other nonspecific abnormal finding of lung field: Secondary | ICD-10-CM | POA: Diagnosis not present

## 2021-06-20 LAB — BASIC METABOLIC PANEL
Anion gap: 7 (ref 5–15)
BUN: 15 mg/dL (ref 6–20)
CO2: 26 mmol/L (ref 22–32)
Calcium: 8.6 mg/dL — ABNORMAL LOW (ref 8.9–10.3)
Chloride: 104 mmol/L (ref 98–111)
Creatinine, Ser: 1.05 mg/dL (ref 0.61–1.24)
GFR, Estimated: >60 mL/min (ref >60)
Glucose, Bld: 81 mg/dL (ref 70–99)
Potassium: 3.7 mmol/L (ref 3.5–5.1)
Sodium: 137 mmol/L (ref 135–145)

## 2021-06-20 LAB — CBC
HCT: 38 % — ABNORMAL LOW (ref 39.0–52.0)
Hemoglobin: 12.7 g/dL — ABNORMAL LOW (ref 13.0–17.0)
MCH: 28 pg (ref 26.0–34.0)
MCHC: 33.4 g/dL (ref 30.0–36.0)
MCV: 83.7 fL (ref 80.0–100.0)
Platelets: 199 10*3/uL (ref 150–400)
RBC: 4.54 MIL/uL (ref 4.22–5.81)
RDW: 13.2 % (ref 11.5–15.5)
WBC: 3.6 10*3/uL — ABNORMAL LOW (ref 4.0–10.5)
nRBC: 0 % (ref 0.0–0.2)

## 2021-06-20 LAB — MAGNESIUM: Magnesium: 2.1 mg/dL (ref 1.7–2.4)

## 2021-06-20 MED ORDER — IOHEXOL 300 MG/ML  SOLN
75.0000 mL | Freq: Once | INTRAMUSCULAR | Status: AC | PRN
  Administered 2021-06-20: 75 mL via INTRAVENOUS

## 2021-06-20 NOTE — Progress Notes (Signed)
? ?Progress Note ? ?   ?Subjective: ?Pt reports having a BM overnight. Denies nausea or vomiting. He has a lot of questions regarding fluid collections vs soft tissue densities this AM, we discussed that we will know more once IR able to biopsy/sample these.  ? ?Objective: ?Vital signs in last 24 hours: ?Temp:  [98 ?F (36.7 ?C)-99 ?F (37.2 ?C)] 98.6 ?F (37 ?C) (03/01 0731) ?Pulse Rate:  [51-87] 64 (03/01 0731) ?Resp:  [15-18] 16 (03/01 0731) ?BP: (100-143)/(70-93) 123/79 (03/01 0731) ?SpO2:  [97 %-100 %] 98 % (03/01 0731) ?Last BM Date : 06/19/21 ? ?Intake/Output from previous day: ?02/28 0701 - 03/01 0700 ?In: 1534.4 [I.V.:534.4; IV Piggyback:1000] ?Out: -  ?Intake/Output this shift: ?No intake/output data recorded. ? ?PE: ?General: pleasant, WD, WN male who is laying in bed in NAD ?Heart: regular, rate, and rhythm.   ?Lungs: Respiratory effort nonlabored ?Abd: soft, NT, ND, +BS, midline and lower transverse surgical scars well healed ?MS: all 4 extremities are symmetrical with no cyanosis, clubbing, or edema. ?Skin: warm and dry with no masses, lesions, or rashes ?Psych: A&Ox3 with an appropriate affect.  ? ? ?Lab Results:  ?Recent Labs  ?  06/19/21 ?0234 06/20/21 ?0226  ?WBC 8.3 3.6*  ?HGB 14.1 12.7*  ?HCT 42.3 38.0*  ?PLT 260 199  ? ?BMET ?Recent Labs  ?  06/19/21 ?0234 06/20/21 ?0226  ?NA 134* 137  ?K 4.1 3.7  ?CL 96* 104  ?CO2 26 26  ?GLUCOSE 165* 81  ?BUN 12 15  ?CREATININE 1.17 1.05  ?CALCIUM 9.7 8.6*  ? ?PT/INR ?No results for input(s): LABPROT, INR in the last 72 hours. ?CMP  ?   ?Component Value Date/Time  ? NA 137 06/20/2021 0226  ? K 3.7 06/20/2021 0226  ? CL 104 06/20/2021 0226  ? CO2 26 06/20/2021 0226  ? GLUCOSE 81 06/20/2021 0226  ? BUN 15 06/20/2021 0226  ? CREATININE 1.05 06/20/2021 0226  ? CALCIUM 8.6 (L) 06/20/2021 0226  ? PROT 8.8 (H) 06/19/2021 0234  ? ALBUMIN 4.5 06/19/2021 0234  ? AST 39 06/19/2021 0234  ? ALT 32 06/19/2021 0234  ? ALKPHOS 120 06/19/2021 0234  ? BILITOT 0.5 06/19/2021 0234   ? GFRNONAA >60 06/20/2021 0226  ? ?Lipase  ?   ?Component Value Date/Time  ? LIPASE 42 06/19/2021 0234  ? ? ? ? ? ?Studies/Results: ?CT ABDOMEN PELVIS W CONTRAST ? ?Result Date: 06/19/2021 ?CLINICAL DATA:  Nausea vomiting with abdominal pain. EXAM: CT ABDOMEN AND PELVIS WITH CONTRAST TECHNIQUE: Multidetector CT imaging of the abdomen and pelvis was performed using the standard protocol following bolus administration of intravenous contrast. RADIATION DOSE REDUCTION: This exam was performed according to the departmental dose-optimization program which includes automated exposure control, adjustment of the mA and/or kV according to patient size and/or use of iterative reconstruction technique. CONTRAST:  177m OMNIPAQUE IOHEXOL 300 MG/ML  SOLN COMPARISON:  None. FINDINGS: Lower chest: Unremarkable. Hepatobiliary: No suspicious focal abnormality within the liver parenchyma. There is no evidence for gallstones, gallbladder wall thickening, or pericholecystic fluid. No intrahepatic or extrahepatic biliary dilation. Pancreas: No focal mass lesion. No dilatation of the main duct. No intraparenchymal cyst. No peripancreatic edema. Spleen: No splenomegaly. No focal mass lesion. Adrenals/Urinary Tract: No adrenal nodule or mass. Kidneys unremarkable. No evidence for hydroureter. The urinary bladder appears normal for the degree of distention. Stomach/Bowel: Stomach is unremarkable. No gastric wall thickening. No evidence of outlet obstruction. Duodenum is normally positioned as is the ligament of Treitz. Small bowel  in the left upper quadrant and central abdomen is dilated and fluid-filled measuring up to 4.8 cm diameter. Fecalization of enteric contents noted in the left abdominal loop (axial image 49/3) with rapid tapering into a nondilated loop (axial 47/3) this appears to be in the region of the distal jejunum. Ileal loops are decompressed. Terminal ileum unremarkable. The appendix is normal. Colon is diffusely  decompressed. Vascular/Lymphatic: No abdominal aortic aneurysm. No abdominal aortic atherosclerotic calcification. There is no gastrohepatic or hepatoduodenal ligament lymphadenopathy. No retroperitoneal or mesenteric lymphadenopathy. No pelvic sidewall lymphadenopathy. Reproductive: The prostate gland and seminal vesicles are unremarkable. Other: Multiple well-defined homogeneous collections of loculated fluid or low-density soft tissue are identified throughout the peritoneal cavity. 4.2 x 3.1 cm lesion identified in the lesser sac on image 19/series 3. 4.7 x 1.7 cm collection identified anterior to the liver on image 11/series 3. 3.7 x 1.8 cm collection identified anterior to the inferior vena cava on 43/3. Several omental collections are evident. Material is identified in the left paracolic gutter (images 91-79 of series 3. Small volume free fluid noted in the posterior pelvis. Musculoskeletal: No worrisome lytic or sclerotic osseous abnormality. IMPRESSION: 1. Multiple well-defined homogeneous collections of loculated fluid or low-density soft tissue throughout the peritoneal cavity and in the anterior right juxta. Imaging features are nonspecific but raise the question of peritoneal carcinomatosis. PET-CT may prove helpful to further evaluate. Ultimately, tissue sampling likely warranted. No appendix is visible on the study in the patient may be status post appendectomy. If so, correlation with pathology report from appendectomy suggested. 2. Small bowel obstruction with transition point likely in the region of the distal jejunum. 3. Small volume free fluid in the posterior pelvis. Electronically Signed   By: Misty Stanley M.D.   On: 06/19/2021 06:22  ? ?DG Abd Portable 1V-Small Bowel Obstruction Protocol-initial, 8 hr delay ? ?Result Date: 06/19/2021 ?CLINICAL DATA:  Small-bowel obstruction EXAM: PORTABLE ABDOMEN - 1 VIEW COMPARISON:  06/19/2021 FINDINGS: Two supine frontal views of the abdomen and pelvis  demonstrate oral contrast throughout the colon. Persistent nonspecific gaseous distention of the small bowel within the central abdomen, decreasing caliber since prior CT. No abdominal masses. No acute bony abnormalities. IMPRESSION: 1. Transit of oral contrast into the colon, excluding high-grade obstruction. 2. Nonspecific gaseous distention of the small bowel within the central abdomen, decreased in caliber since recent CT. Electronically Signed   By: Randa Ngo M.D.   On: 06/19/2021 22:46   ? ?Anti-infectives: ?Anti-infectives (From admission, onward)  ? ? None  ? ?  ? ? ? ?Assessment/Plan ?SBO ?Possible peritoneal carcinomatosis ?- CT 2/28 with well defined collections of loculated fluid of low-density soft tissue throughout peritoneal cavity and in anterior right juxta, question of peritoneal carcinomatosis, SBO w/ transition likely in the distal jejunum  ?- no leukocytosis, afebrile ?- abdominal exam benign, no further vomiting, stomach decompressed on his CT scan ?- got PO gastrografin yesterday and contrast cleared to colon on 8h delay film, pt having bowel function  ?- ok to start CLD today ?- IR wanted patient to be better from SBO standpoint prior to consideration of any biopsy - I think he has currently met this requirement. Also recommending CT chest, primary attending to order today ?- no indication for emergent surgery  ?  ?FEN - CLD, IVF @ 75 cc/h ?VTE - SCDs, LMWH ?ID - afebrile, WBC WNL, there are intra-abdominal fluid collections present but I dont feel strongly that IV abx are currently indicated ?  ?  Hx of epilepsy with last seizure in 2014 ? LOS: 0 days  ? ?I reviewed Consultant IR notes, hospitalist notes, last 24 h vitals and pain scores, last 48 h intake and output, last 24 h labs and trends, and last 24 h imaging results. ? ?This care required moderate level of medical decision making.  ? ? ?Norm Parcel, PA-C ?[facility] ?06/20/2021, 9:19 AM ?Please see Amion for pager  number during day hours 7:00am-4:30pm ? ?

## 2021-06-20 NOTE — Hospital Course (Addendum)
41 y.o. male with medical history significant of seizure disorder and SBO s/p exploratory surgeries at the age 41 and 32 presented with complaints of left upper quadrant abdominal pain.  CT scan raise concern for small bowel obstruction.  CT scan also showed multiple ill-defined collections of loculated fluid of soft tissue throughout the peritoneal cavity.   ?

## 2021-06-20 NOTE — Progress Notes (Signed)
Interventional Radiology Brief Note: ? ?IR consulted for possible biopsy of loculate fluid vs. Soft tissue masses in the peritoneum.  Case reviewed by Dr. Serafina Royals who notes that the areas/masses in question are not amenable for percutaneous biopsy.  If further concern for malignancy, recommend PET CT.  ? ?Ordering team notified via secure chat.  ? ?Brynda Greathouse, MS RD PA-C ? ? ?

## 2021-06-20 NOTE — Progress Notes (Signed)
[facility] PROGRESS NOTE   Kyle Sharp IWL:[id number] DOB: 1981-02-28 DOA: 06/19/2021  0 DOS: the patient was seen and examined on 06/20/2021  PCP: Vernie Shanks, MD  Brief History and Hospital Course:  41 y.o. male with medical history significant of seizure disorder and SBO s/p exploratory surgeries at the age 59 and 89 presented with complaints of left upper quadrant abdominal pain.  CT scan raise concern for small bowel obstruction.  CT scan also showed multiple ill-defined collections of loculated fluid of soft tissue throughout the peritoneal cavity.  General surgery was consulted.  IR was consulted for biopsy.  Consultants: General surgery.  Interventional radiology  Procedures: None yet    Subjective: Patient feels better.  Has had bowel movements.  Denies any nausea vomiting.  Anxious about the findings on the CT scan.    Assessment/Plan:   * SBO (small bowel obstruction) ([place])- (present on admission) Patient with previous history of abdominal surgeries and small bowel obstruction.  General surgery was consulted.  Seems to be improving.  He has had multiple bowel movements.  Imaging study showed that contrast was noted to be in the colon.  Abdomen is soft.  Diet to be advanced gradually.  Abnormal CT of the abdomen- (present on admission) Apart from small bowel obstruction there were also findings suggestive and concerning for peritoneal carcinomatosis.  CT abdomen pelvis did not show any mass in the pancreas or in the stomach.  No lesions in the hepatobiliary system also noted. Interventional radiology was consulted.  They are requesting a CT of the chest before pursuing biopsy. This will be ordered. We will check CEA, CA 19-9.   Seizure disorder [facility]) Patient with a prior history of seizure disorder.  Last seizure noted to be back in 2014.  Currently on Tegretol extended release 400 mg twice daily.  Hyponatremia Sodium level has improved.  Continue to  monitor periodically.    Hyperglycemia This was a nonfasting level.  Noted to be 81 this morning.  Outpatient monitoring.     Obesity Estimated body mass index is 30.81 kg/m as calculated from the following:   Height as of 09/23/20: 6\' 2"  (1.88 m).   Weight as of 09/23/20: 108.9 kg.   DVT Prophylaxis: Lovenox Code Status: Full code Family Communication: Discussed with patient Disposition Plan: Hopefully return home in improved  Status is: Observation The patient will require care spanning > 2 midnights and should be moved to inpatient because: Small bowel obstruction      Medications: Scheduled:  carbamazepine  400 mg Oral BID   enoxaparin (LOVENOX) injection  40 mg Subcutaneous Daily   pantoprazole (PROTONIX) IV  40 mg Intravenous Q24H   sodium chloride flush  3 mL Intravenous Q12H   Continuous:  sodium chloride 75 mL/hr at 06/19/21 1352   RDE:YCXKGYJEHUDJS **OR** acetaminophen, albuterol, morphine injection, ondansetron **OR** ondansetron (ZOFRAN) IV  Antibiotics: Anti-infectives (From admission, onward)    None       Objective:  Vital Signs  Vitals:   06/19/21 2000 06/19/21 2112 06/20/21 0523 06/20/21 0731  BP: 138/84 130/84 100/70 123/79  Pulse: 63 63 (!) 51 64  Resp: 16 17 18 16   Temp: 98.5 F (36.9 C) 98.6 F (37 C) 98 F (36.7 C) 98.6 F (37 C)  TempSrc:  Oral  Oral  SpO2: 97% 98% 99% 98%    Intake/Output Summary (Last 24 hours) at 06/20/2021 1128 Last data filed at 06/19/2021 2230 Gross per 24 hour  Intake 1534.42 ml  Output --  Net 1534.42 ml   There were no vitals filed for this visit.  General appearance: Awake alert.  In no distress Resp: Clear to auscultation bilaterally.  Normal effort Cardio: S1-S2 is normal regular.  No S3-S4.  No rubs murmurs or bruit GI: Abdomen is soft.  Nontender nondistended.  Bowel sounds are present normal.  No masses organomegaly Extremities: No edema.  Full range of motion of lower extremities. Neurologic:  Alert and oriented x3.  No focal neurological deficits.    Lab Results:  Data Reviewed: I have personally reviewed labs and imaging study reports  CBC: Recent Labs  Lab 06/19/21 0234 06/20/21 0226  WBC 8.3 3.6*  NEUTROABS 5.3  --   HGB 14.1 12.7*  HCT 42.3 38.0*  MCV 83.3 83.7  PLT 260 277    Basic Metabolic Panel: Recent Labs  Lab 06/19/21 0234 06/20/21 0226  NA 134* 137  K 4.1 3.7  CL 96* 104  CO2 26 26  GLUCOSE 165* 81  BUN 12 15  CREATININE 1.17 1.05  CALCIUM 9.7 8.6*  MG  --  2.1    GFR: CrCl cannot be calculated (Unknown ideal weight.).  Liver Function Tests: Recent Labs  Lab 06/19/21 0234  AST 39  ALT 32  ALKPHOS 120  BILITOT 0.5  PROT 8.8*  ALBUMIN 4.5    Recent Labs  Lab 06/19/21 0234  LIPASE 42    Recent Results (from the past 240 hour(s))  Resp Panel by RT-PCR (Flu A&B, Covid) Nasopharyngeal Swab     Status: None   Collection Time: 06/19/21 10:43 AM   Specimen: Nasopharyngeal Swab; Nasopharyngeal(NP) swabs in vial transport medium  Result Value Ref Range Status   SARS Coronavirus 2 by RT PCR NEGATIVE NEGATIVE Final    Comment: (NOTE) SARS-CoV-2 target nucleic acids are NOT DETECTED.  The SARS-CoV-2 RNA is generally detectable in upper respiratory specimens during the acute phase of infection. The lowest concentration of SARS-CoV-2 viral copies this assay can detect is 138 copies/mL. A negative result does not preclude SARS-Cov-2 infection and should not be used as the sole basis for treatment or other patient management decisions. A negative result may occur with  improper specimen collection/handling, submission of specimen other than nasopharyngeal swab, presence of viral mutation(s) within the areas targeted by this assay, and inadequate number of viral copies(<138 copies/mL). A negative result must be combined with clinical observations, patient history, and epidemiological information. The expected result is  Negative.  Fact Sheet for Patients:  EntrepreneurPulse.com.au  Fact Sheet for Healthcare Providers:  IncredibleEmployment.be  This test is no t yet approved or cleared by the [place] FDA and  has been authorized for detection and/or diagnosis of SARS-CoV-2 by FDA under an Emergency Use Authorization (EUA). This EUA will remain  in effect (meaning this test can be used) for the duration of the COVID-19 declaration under Section 564(b)(1) of the Act, 21 U.S.C.section 360bbb-3(b)(1), unless the authorization is terminated  or revoked sooner.       Influenza A by PCR NEGATIVE NEGATIVE Final   Influenza B by PCR NEGATIVE NEGATIVE Final    Comment: (NOTE) The Xpert Xpress SARS-CoV-2/FLU/RSV plus assay is intended as an aid in the diagnosis of influenza from Nasopharyngeal swab specimens and should not be used as a sole basis for treatment. Nasal washings and aspirates are unacceptable for Xpert Xpress SARS-CoV-2/FLU/RSV testing.  Fact Sheet for Patients: EntrepreneurPulse.com.au  Fact Sheet for Healthcare Providers: IncredibleEmployment.be  This test is not yet  approved or cleared by the [place] and has been authorized for detection and/or diagnosis of SARS-CoV-2 by FDA under an Emergency Use Authorization (EUA). This EUA will remain in effect (meaning this test can be used) for the duration of the COVID-19 declaration under Section 564(b)(1) of the Act, 21 U.S.C. section 360bbb-3(b)(1), unless the authorization is terminated or revoked.  Performed at [facility], [place] [address], [place], [place] [address]       Radiology Studies: CT ABDOMEN PELVIS W CONTRAST  Result Date: 06/19/2021 CLINICAL DATA:  Nausea vomiting with abdominal pain. EXAM: CT ABDOMEN AND PELVIS WITH CONTRAST TECHNIQUE: Multidetector CT imaging of the abdomen and pelvis was performed using the standard protocol  following bolus administration of intravenous contrast. RADIATION DOSE REDUCTION: This exam was performed according to the departmental dose-optimization program which includes automated exposure control, adjustment of the mA and/or kV according to patient size and/or use of iterative reconstruction technique. CONTRAST:  189mL OMNIPAQUE IOHEXOL 300 MG/ML  SOLN COMPARISON:  None. FINDINGS: Lower chest: Unremarkable. Hepatobiliary: No suspicious focal abnormality within the liver parenchyma. There is no evidence for gallstones, gallbladder wall thickening, or pericholecystic fluid. No intrahepatic or extrahepatic biliary dilation. Pancreas: No focal mass lesion. No dilatation of the main duct. No intraparenchymal cyst. No peripancreatic edema. Spleen: No splenomegaly. No focal mass lesion. Adrenals/Urinary Tract: No adrenal nodule or mass. Kidneys unremarkable. No evidence for hydroureter. The urinary bladder appears normal for the degree of distention. Stomach/Bowel: Stomach is unremarkable. No gastric wall thickening. No evidence of outlet obstruction. Duodenum is normally positioned as is the ligament of Treitz. Small bowel in the left upper quadrant and central abdomen is dilated and fluid-filled measuring up to 4.8 cm diameter. Fecalization of enteric contents noted in the left abdominal loop (axial image 49/3) with rapid tapering into a nondilated loop (axial 47/3) this appears to be in the region of the distal jejunum. Ileal loops are decompressed. Terminal ileum unremarkable. The appendix is normal. Colon is diffusely decompressed. Vascular/Lymphatic: No abdominal aortic aneurysm. No abdominal aortic atherosclerotic calcification. There is no gastrohepatic or hepatoduodenal ligament lymphadenopathy. No retroperitoneal or mesenteric lymphadenopathy. No pelvic sidewall lymphadenopathy. Reproductive: The prostate gland and seminal vesicles are unremarkable. Other: Multiple well-defined homogeneous collections of  loculated fluid or low-density soft tissue are identified throughout the peritoneal cavity. 4.2 x 3.1 cm lesion identified in the lesser sac on image 19/series 3. 4.7 x 1.7 cm collection identified anterior to the liver on image 11/series 3. 3.7 x 1.8 cm collection identified anterior to the inferior vena cava on 43/3. Several omental collections are evident. Material is identified in the left paracolic gutter (images 53-61 of series 3. Small volume free fluid noted in the posterior pelvis. Musculoskeletal: No worrisome lytic or sclerotic osseous abnormality. IMPRESSION: 1. Multiple well-defined homogeneous collections of loculated fluid or low-density soft tissue throughout the peritoneal cavity and in the anterior right juxta. Imaging features are nonspecific but raise the question of peritoneal carcinomatosis. PET-CT may prove helpful to further evaluate. Ultimately, tissue sampling likely warranted. No appendix is visible on the study in the patient may be status post appendectomy. If so, correlation with pathology report from appendectomy suggested. 2. Small bowel obstruction with transition point likely in the region of the distal jejunum. 3. Small volume free fluid in the posterior pelvis. Electronically Signed   By: Misty Stanley M.D.   On: 06/19/2021 06:22   DG Abd Portable 1V-Small Bowel Obstruction Protocol-initial, 8 hr delay  Result Date:  06/19/2021 CLINICAL DATA:  Small-bowel obstruction EXAM: PORTABLE ABDOMEN - 1 VIEW COMPARISON:  06/19/2021 FINDINGS: Two supine frontal views of the abdomen and pelvis demonstrate oral contrast throughout the colon. Persistent nonspecific gaseous distention of the small bowel within the central abdomen, decreasing caliber since prior CT. No abdominal masses. No acute bony abnormalities. IMPRESSION: 1. Transit of oral contrast into the colon, excluding high-grade obstruction. 2. Nonspecific gaseous distention of the small bowel within the central abdomen, decreased  in caliber since recent CT. Electronically Signed   By: Randa Ngo M.D.   On: 06/19/2021 22:46       LOS: 0 days   [facility] Pager on [email or web address]  06/20/2021, 11:28 AM

## 2021-06-21 ENCOUNTER — Other Ambulatory Visit: Payer: Self-pay | Admitting: Oncology

## 2021-06-21 ENCOUNTER — Telehealth: Payer: Self-pay | Admitting: Physician Assistant

## 2021-06-21 DIAGNOSIS — Z8249 Family history of ischemic heart disease and other diseases of the circulatory system: Secondary | ICD-10-CM | POA: Diagnosis not present

## 2021-06-21 DIAGNOSIS — E669 Obesity, unspecified: Secondary | ICD-10-CM | POA: Diagnosis present

## 2021-06-21 DIAGNOSIS — K56609 Unspecified intestinal obstruction, unspecified as to partial versus complete obstruction: Secondary | ICD-10-CM | POA: Diagnosis not present

## 2021-06-21 DIAGNOSIS — Z803 Family history of malignant neoplasm of breast: Secondary | ICD-10-CM | POA: Diagnosis not present

## 2021-06-21 DIAGNOSIS — R739 Hyperglycemia, unspecified: Secondary | ICD-10-CM | POA: Diagnosis present

## 2021-06-21 DIAGNOSIS — Z20822 Contact with and (suspected) exposure to covid-19: Secondary | ICD-10-CM | POA: Diagnosis present

## 2021-06-21 DIAGNOSIS — C786 Secondary malignant neoplasm of retroperitoneum and peritoneum: Secondary | ICD-10-CM | POA: Diagnosis present

## 2021-06-21 DIAGNOSIS — G40909 Epilepsy, unspecified, not intractable, without status epilepticus: Secondary | ICD-10-CM | POA: Diagnosis present

## 2021-06-21 DIAGNOSIS — R1013 Epigastric pain: Secondary | ICD-10-CM | POA: Diagnosis present

## 2021-06-21 DIAGNOSIS — E860 Dehydration: Secondary | ICD-10-CM | POA: Diagnosis present

## 2021-06-21 DIAGNOSIS — E871 Hypo-osmolality and hyponatremia: Secondary | ICD-10-CM | POA: Diagnosis present

## 2021-06-21 DIAGNOSIS — Z6830 Body mass index (BMI) 30.0-30.9, adult: Secondary | ICD-10-CM | POA: Diagnosis not present

## 2021-06-21 LAB — CBC
HCT: 36.5 % — ABNORMAL LOW (ref 39.0–52.0)
Hemoglobin: 12.8 g/dL — ABNORMAL LOW (ref 13.0–17.0)
MCH: 29.2 pg (ref 26.0–34.0)
MCHC: 35.1 g/dL (ref 30.0–36.0)
MCV: 83.1 fL (ref 80.0–100.0)
Platelets: 178 10*3/uL (ref 150–400)
RBC: 4.39 MIL/uL (ref 4.22–5.81)
RDW: 12.8 % (ref 11.5–15.5)
WBC: 3.7 10*3/uL — ABNORMAL LOW (ref 4.0–10.5)
nRBC: 0 % (ref 0.0–0.2)

## 2021-06-21 LAB — COMPREHENSIVE METABOLIC PANEL
ALT: 21 U/L (ref 0–44)
AST: 25 U/L (ref 15–41)
Albumin: 3.6 g/dL (ref 3.5–5.0)
Alkaline Phosphatase: 86 U/L (ref 38–126)
Anion gap: 9 (ref 5–15)
BUN: 9 mg/dL (ref 6–20)
CO2: 28 mmol/L (ref 22–32)
Calcium: 8.9 mg/dL (ref 8.9–10.3)
Chloride: 101 mmol/L (ref 98–111)
Creatinine, Ser: 1.05 mg/dL (ref 0.61–1.24)
GFR, Estimated: >60 mL/min (ref >60)
Glucose, Bld: 87 mg/dL (ref 70–99)
Potassium: 3.9 mmol/L (ref 3.5–5.1)
Sodium: 138 mmol/L (ref 135–145)
Total Bilirubin: 0.8 mg/dL (ref 0.3–1.2)
Total Protein: 7.2 g/dL (ref 6.5–8.1)

## 2021-06-21 LAB — PSA, TOTAL AND FREE
PSA, Free Pct: 50 %
PSA, Free: 0.15 ng/mL
Prostate Specific Ag, Serum: 0.3 ng/mL (ref 0.0–4.0)

## 2021-06-21 LAB — CANCER ANTIGEN 19-9: CA 19-9: <2 U/mL (ref 0–35)

## 2021-06-21 LAB — HIV ANTIBODY (ROUTINE TESTING W REFLEX): HIV Screen 4th Generation wRfx: NONREACTIVE

## 2021-06-21 LAB — CEA: CEA: 1.1 ng/mL (ref 0.0–4.7)

## 2021-06-21 MED ORDER — ONDANSETRON HCL 4 MG PO TABS
4.0000 mg | ORAL_TABLET | Freq: Three times a day (TID) | ORAL | 0 refills | Status: DC | PRN
Start: 2021-06-21 — End: 2023-08-11

## 2021-06-21 NOTE — Progress Notes (Signed)
? ?Progress Note ? ?   ?Subjective: ?Tolerating CLD and continues to have bowel function. Denies abdominal pain or n/v. His wife was on speaker phone and they had a lot of questions regarding next steps for workup of abnormal collections on CT. We reviewed the CTs together and I let them know that I would discuss with IR and internal medicine today to try to determine best next steps.  ? ?Objective: ?Vital signs in last 24 hours: ?Temp:  [98.3 ?F (36.8 ?C)-98.6 ?F (37 ?C)] 98.3 ?F (36.8 ?C) (03/02 0518) ?Pulse Rate:  [56-72] 59 (03/02 0518) ?Resp:  [16] 16 (03/02 0518) ?BP: (123-137)/(80-97) 123/80 (03/02 0518) ?SpO2:  [95 %-99 %] 95 % (03/02 0518) ?Last BM Date : 06/19/21 ? ?Intake/Output from previous day: ?03/01 0701 - 03/02 0700 ?In: 750 [I.V.:750] ?Out: -  ?Intake/Output this shift: ?No intake/output data recorded. ? ?PE: ?General: pleasant, WD, WN male who is laying in bed in NAD ?Heart: regular, rate, and rhythm.   ?Lungs: Respiratory effort nonlabored ?Abd: soft, NT, ND, +BS, midline and lower transverse surgical scars well healed ?MS: all 4 extremities are symmetrical with no cyanosis, clubbing, or edema. ?Skin: warm and dry with no masses, lesions, or rashes ?Psych: A&Ox3 with an appropriate affect.  ? ? ?Lab Results:  ?Recent Labs  ?  06/20/21 ?0226 06/21/21 ?0400  ?WBC 3.6* 3.7*  ?HGB 12.7* 12.8*  ?HCT 38.0* 36.5*  ?PLT 199 178  ? ?BMET ?Recent Labs  ?  06/20/21 ?0226 06/21/21 ?0400  ?NA 137 138  ?K 3.7 3.9  ?CL 104 101  ?CO2 26 28  ?GLUCOSE 81 87  ?BUN 15 9  ?CREATININE 1.05 1.05  ?CALCIUM 8.6* 8.9  ? ?PT/INR ?No results for input(s): LABPROT, INR in the last 72 hours. ?CMP  ?   ?Component Value Date/Time  ? NA 138 06/21/2021 0400  ? K 3.9 06/21/2021 0400  ? CL 101 06/21/2021 0400  ? CO2 28 06/21/2021 0400  ? GLUCOSE 87 06/21/2021 0400  ? BUN 9 06/21/2021 0400  ? CREATININE 1.05 06/21/2021 0400  ? CALCIUM 8.9 06/21/2021 0400  ? PROT 7.2 06/21/2021 0400  ? ALBUMIN 3.6 06/21/2021 0400  ? AST 25  06/21/2021 0400  ? ALT 21 06/21/2021 0400  ? ALKPHOS 86 06/21/2021 0400  ? BILITOT 0.8 06/21/2021 0400  ? GFRNONAA >60 06/21/2021 0400  ? ?Lipase  ?   ?Component Value Date/Time  ? LIPASE 42 06/19/2021 0234  ? ? ? ? ? ?Studies/Results: ?CT CHEST W CONTRAST ? ?Result Date: 06/20/2021 ?CLINICAL DATA:  Evaluate for occult malignancy. EXAM: CT CHEST WITH CONTRAST TECHNIQUE: Multidetector CT imaging of the chest was performed during intravenous contrast administration. RADIATION DOSE REDUCTION: This exam was performed according to the departmental dose-optimization program which includes automated exposure control, adjustment of the mA and/or kV according to patient size and/or use of iterative reconstruction technique. CONTRAST:  19mL OMNIPAQUE IOHEXOL 300 MG/ML  SOLN COMPARISON:  CT abdomen 06/19/2021 FINDINGS: Cardiovascular: Normal caliber of the thoracic aorta. Heart size is normal. No pericardial effusion. Mediastinum/Nodes: Again noted are multiple low-density nodular structures in the anterior cardiophrenic region particularly anterior to the liver. These are suspicious for low-density soft tissue nodules. Largest nodule is anterior to the liver and measures 3.7 x 2.2 x 2.2 cm on sequence 3 image 124. Remainder of the mediastinum appears normal. No hilar lymph node enlargement. No axillary lymph node enlargement. No enlarged lymph nodes in the supraclavicular region. Thyroid tissue is unremarkable. Normal appearance of  the esophagus. Lungs/Pleura: Trachea and mainstem bronchi are patent. No pleural effusions. No airspace disease or lung consolidation. No suspicious pulmonary nodules. Few dependent densities in the posterior left lower lobe are suggestive for atelectasis or mild scarring. Upper Abdomen: Again noted are low-density nodules or lymph nodes in the gastrohepatic ligament, largest measures 3.4 cm on sequence 3, image 147. Small low-density nodules in the right anterior peritoneal cavity near the colon.  Soft tissue nodule in the anterior abdomen on sequence 3 image 167 measuring 2.5 cm. Small bowel distension has decreased. Again noted is a prominent soft tissue nodule just anterior to the IVC on sequence 3 image 204. Normal appearance of the liver and gallbladder. Abnormal soft tissue along the medial aspect of the spleen. Musculoskeletal: No acute bone abnormality. No suspicious osseous lesions. IMPRESSION: 1. No suspicious pulmonary nodules. No evidence for a primary lung neoplasm. 2. Multiple abnormal low-density nodules throughout the abdomen and in the cardiophrenic spaces. Findings are similar to the recent abdominal CT and suggestive for metastatic disease. 3. Decreased small bowel distension since 06/19/2021. Electronically Signed   By: Markus Daft M.D.   On: 06/20/2021 13:49  ? ?DG Abd Portable 1V-Small Bowel Obstruction Protocol-initial, 8 hr delay ? ?Result Date: 06/19/2021 ?CLINICAL DATA:  Small-bowel obstruction EXAM: PORTABLE ABDOMEN - 1 VIEW COMPARISON:  06/19/2021 FINDINGS: Two supine frontal views of the abdomen and pelvis demonstrate oral contrast throughout the colon. Persistent nonspecific gaseous distention of the small bowel within the central abdomen, decreasing caliber since prior CT. No abdominal masses. No acute bony abnormalities. IMPRESSION: 1. Transit of oral contrast into the colon, excluding high-grade obstruction. 2. Nonspecific gaseous distention of the small bowel within the central abdomen, decreased in caliber since recent CT. Electronically Signed   By: Randa Ngo M.D.   On: 06/19/2021 22:46   ? ?Anti-infectives: ?Anti-infectives (From admission, onward)  ? ? None  ? ?  ? ? ? ?Assessment/Plan ?SBO ?Possible peritoneal carcinomatosis ?- CT 2/28 with well defined collections of loculated fluid of low-density soft tissue throughout peritoneal cavity and in anterior right juxta, question of peritoneal carcinomatosis, SBO w/ transition likely in the distal jejunum  ?- no  leukocytosis, afebrile ?- abdominal exam benign, no further vomiting, stomach decompressed on his CT scan ?- got PO gastrografin 2/28 and contrast cleared to colon on 8h delay film, pt having bowel function  ?- tolerating CLD - advance to soft diet  ?- IR felt nothing was accessible for biopsy yesterday, will review with them again today. Not sure what next steps would be if this remains the case. Surgery for tissue diagnosis in this patient is liable to be complicated with previous surgical hx. ?outpatient PET scan ?- tumor markers still pending  ?- no indication for emergent surgery  ?  ?FEN - soft diet, IVF @ 75 cc/h ?VTE - SCDs, LMWH ?ID - no abx indicated from a surgical standpoint  ?  ?Hx of epilepsy with last seizure in 2014 ? LOS: 0 days  ? ?I reviewed hospitalist notes, last 24 h vitals and pain scores, last 48 h intake and output, and last 24 h labs and trends. ? ?This care required moderate level of medical decision making.  ? ? ?Norm Parcel, PA-C ?[facility] ?06/21/2021, 8:46 AM ?Please see Amion for pager number during day hours 7:00am-4:30pm ? ?

## 2021-06-21 NOTE — Progress Notes (Signed)
Interventional Radiology Brief Note: ? ?Further review of case performed by Dr. Dwaine Gale this AM.  He agrees with initial recommendations of Dr. Serafina Royals that the masses are not clearly indicative of a metastatic process and are at increased risk for complication with percutaneous biopsy due to location.  He recommends consideration for alternate diagnosis as well as work up on outpatient basis with PET scan prior to any biopsy.  If outside CT scans are available they could be reviewed to see if the findings are stable. ? ?Brynda Greathouse, MS RD PA-C ? ? ?

## 2021-06-21 NOTE — Telephone Encounter (Signed)
Scheduled appt per 3/2 referral. Pt is aware of appt date and time. Pt is aware to arrive 15 mins prior to appt time and to bring and updated insurance card. Pt is aware of appt location.   ?

## 2021-06-22 ENCOUNTER — Encounter: Payer: Self-pay | Admitting: *Deleted

## 2021-06-22 NOTE — Discharge Summary (Addendum)
[facility]  Physician Discharge Summary   Patient ID: Kyle Sharp MRN: [id number] DOB/AGE: May 22, 1980 41 y.o.  Admit date: 06/19/2021 Discharge date: 06/21/2021    PCP: Vernie Shanks, MD  DISCHARGE DIAGNOSES:  Principal Problem:   SBO (small bowel obstruction) ([place]) Active Problems:   Abnormal CT of the abdomen   Seizure disorder ([place])   Hyponatremia   Hyperglycemia    RECOMMENDATIONS FOR OUTPATIENT FOLLOW UP: Patient to be scheduled appointment at rapid diagnosis clinic at the cancer center.  They will call him with appointment date and time. Follow-up with PCP next week Patient to try and obtain reports of imaging studies done while he was in [place] for comparison with CT scans done during this admission.  PCP may need to facilitate.    Home Health: None Equipment/Devices: None  CODE STATUS: Full code  DISCHARGE CONDITION: fair  Diet recommendation: Soft diet  INITIAL HISTORY: 41 y.o. male with medical history significant of seizure disorder and SBO s/p exploratory surgeries at the age 69 and 18 presented with complaints of left upper quadrant abdominal pain.  CT scan raise concern for small bowel obstruction.  CT scan also showed multiple ill-defined collections of loculated fluid of soft tissue throughout the peritoneal cavity.    Consultations: General surgery.  Interventional radiology  HOSPITAL COURSE:   * SBO (small bowel obstruction) ([place]) Patient with previous history of abdominal surgeries and small bowel obstruction.  General surgery was consulted.  Seems to be improving.  He has had multiple bowel movements.  Imaging study showed that contrast was noted to be in the colon.  Diet was advanced gradually.  Patient did well without any recurrence of nausea or vomiting.  Has been ambulating without difficulty.  Cleared by general surgery for discharge.  Abnormal CT of the abdomen Apart from small bowel obstruction there were also findings  suggestive and concerning for peritoneal carcinomatosis.  CT abdomen pelvis did not show any mass in the pancreas or in the stomach.  No lesions in the hepatobiliary system also noted. Interventional radiology was consulted.  CT chest was done which did not show any new findings.  Interventional radiology felt that there was no safe way to biopsy these lesions.  Discussed with general surgery who also did not feel that there was any surgical we did biopsy these lesions.   Discussed with medical oncology.  Patient will be scheduled an appointment at their rapid diagnosis clinic.   May need to undergo PET CT scan.  Patient was also requested to obtain old CT imagings done while he was in [place] for comparison.   The plan was discussed with patient and his wife and they agree. Tumor markers including CEA, CA 19-9 and PSA were normal.  Seizure disorder [facility]) Patient with a prior history of seizure disorder.  Last seizure noted to be back in 2014.  Currently on Tegretol extended release 400 mg twice daily.  Hyponatremia Sodium level has improved.  Hyperglycemia Elevated glucose level of 165 was nonfasting.  Fasting levels have been normal.  No further inpatient work-up is necessary at this time.      Obesity Estimated body mass index is 30.81 kg/m as calculated from the following:   Height as of 09/23/20: 6\' 2"  (1.88 m).   Weight as of 09/23/20: 108.9 kg.  Patient is stable.  As tolerated soft diet without any nausea vomiting.  Okay for discharge home today.   PERTINENT LABS:  The results of significant diagnostics from this hospitalization (  including imaging, microbiology, ancillary and laboratory) are listed below for reference.    Microbiology: Recent Results (from the past 240 hour(s))  Resp Panel by RT-PCR (Flu A&B, Covid) Nasopharyngeal Swab     Status: None   Collection Time: 06/19/21 10:43 AM   Specimen: Nasopharyngeal Swab; Nasopharyngeal(NP) swabs in vial transport medium   Result Value Ref Range Status   SARS Coronavirus 2 by RT PCR NEGATIVE NEGATIVE Final    Comment: (NOTE) SARS-CoV-2 target nucleic acids are NOT DETECTED.  The SARS-CoV-2 RNA is generally detectable in upper respiratory specimens during the acute phase of infection. The lowest concentration of SARS-CoV-2 viral copies this assay can detect is 138 copies/mL. A negative result does not preclude SARS-Cov-2 infection and should not be used as the sole basis for treatment or other patient management decisions. A negative result may occur with  improper specimen collection/handling, submission of specimen other than nasopharyngeal swab, presence of viral mutation(s) within the areas targeted by this assay, and inadequate number of viral copies(<138 copies/mL). A negative result must be combined with clinical observations, patient history, and epidemiological information. The expected result is Negative.  Fact Sheet for Patients:  EntrepreneurPulse.com.au  Fact Sheet for Healthcare Providers:  IncredibleEmployment.be  This test is no t yet approved or cleared by the [place] FDA and  has been authorized for detection and/or diagnosis of SARS-CoV-2 by FDA under an Emergency Use Authorization (EUA). This EUA will remain  in effect (meaning this test can be used) for the duration of the COVID-19 declaration under Section 564(b)(1) of the Act, 21 U.S.C.section 360bbb-3(b)(1), unless the authorization is terminated  or revoked sooner.       Influenza A by PCR NEGATIVE NEGATIVE Final   Influenza B by PCR NEGATIVE NEGATIVE Final    Comment: (NOTE) The Xpert Xpress SARS-CoV-2/FLU/RSV plus assay is intended as an aid in the diagnosis of influenza from Nasopharyngeal swab specimens and should not be used as a sole basis for treatment. Nasal washings and aspirates are unacceptable for Xpert Xpress SARS-CoV-2/FLU/RSV testing.  Fact Sheet for  Patients: EntrepreneurPulse.com.au  Fact Sheet for Healthcare Providers: IncredibleEmployment.be  This test is not yet approved or cleared by the [place] FDA and has been authorized for detection and/or diagnosis of SARS-CoV-2 by FDA under an Emergency Use Authorization (EUA). This EUA will remain in effect (meaning this test can be used) for the duration of the COVID-19 declaration under Section 564(b)(1) of the Act, 21 U.S.C. section 360bbb-3(b)(1), unless the authorization is terminated or revoked.  Performed at [facility], [place] [address] [place], [place] [address]      Labs:  COVID-19 Labs   Lab Results  Component Value Date   [place] 06/19/2021   [place] Not Detected 06/01/2019   [place] Not Detected 03/09/2019   [place] Not Detected 02/16/2019      Basic Metabolic Panel: Recent Labs  Lab 06/19/21 0234 06/20/21 0226 06/21/21 0400  NA 134* 137 138  K 4.1 3.7 3.9  CL 96* 104 101  CO2 26 26 28   GLUCOSE 165* 81 87  BUN 12 15 9   CREATININE 1.17 1.05 1.05  CALCIUM 9.7 8.6* 8.9  MG  --  2.1  --    Liver Function Tests: Recent Labs  Lab 06/19/21 0234 06/21/21 0400  AST 39 25  ALT 32 21  ALKPHOS 120 86  BILITOT 0.5 0.8  PROT 8.8* 7.2  ALBUMIN 4.5 3.6   Recent Labs  Lab 06/19/21 0234  LIPASE 42    CBC: Recent Labs  Lab 06/19/21 0234 06/20/21 0226 06/21/21 0400  WBC 8.3 3.6* 3.7*  NEUTROABS 5.3  --   --   HGB 14.1 12.7* 12.8*  HCT 42.3 38.0* 36.5*  MCV 83.3 83.7 83.1  PLT 260 199 178   IMAGING STUDIES CT CHEST W CONTRAST  Result Date: 06/20/2021 CLINICAL DATA:  Evaluate for occult malignancy. EXAM: CT CHEST WITH CONTRAST TECHNIQUE: Multidetector CT imaging of the chest was performed during intravenous contrast administration. RADIATION DOSE REDUCTION: This exam was performed according to the departmental dose-optimization program which includes automated exposure  control, adjustment of the mA and/or kV according to patient size and/or use of iterative reconstruction technique. CONTRAST:  65mL OMNIPAQUE IOHEXOL 300 MG/ML  SOLN COMPARISON:  CT abdomen 06/19/2021 FINDINGS: Cardiovascular: Normal caliber of the thoracic aorta. Heart size is normal. No pericardial effusion. Mediastinum/Nodes: Again noted are multiple low-density nodular structures in the anterior cardiophrenic region particularly anterior to the liver. These are suspicious for low-density soft tissue nodules. Largest nodule is anterior to the liver and measures 3.7 x 2.2 x 2.2 cm on sequence 3 image 124. Remainder of the mediastinum appears normal. No hilar lymph node enlargement. No axillary lymph node enlargement. No enlarged lymph nodes in the supraclavicular region. Thyroid tissue is unremarkable. Normal appearance of the esophagus. Lungs/Pleura: Trachea and mainstem bronchi are patent. No pleural effusions. No airspace disease or lung consolidation. No suspicious pulmonary nodules. Few dependent densities in the posterior left lower lobe are suggestive for atelectasis or mild scarring. Upper Abdomen: Again noted are low-density nodules or lymph nodes in the gastrohepatic ligament, largest measures 3.4 cm on sequence 3, image 147. Small low-density nodules in the right anterior peritoneal cavity near the colon. Soft tissue nodule in the anterior abdomen on sequence 3 image 167 measuring 2.5 cm. Small bowel distension has decreased. Again noted is a prominent soft tissue nodule just anterior to the IVC on sequence 3 image 204. Normal appearance of the liver and gallbladder. Abnormal soft tissue along the medial aspect of the spleen. Musculoskeletal: No acute bone abnormality. No suspicious osseous lesions. IMPRESSION: 1. No suspicious pulmonary nodules. No evidence for a primary lung neoplasm. 2. Multiple abnormal low-density nodules throughout the abdomen and in the cardiophrenic spaces. Findings are similar  to the recent abdominal CT and suggestive for metastatic disease. 3. Decreased small bowel distension since 06/19/2021. Electronically Signed   By: Markus Daft M.D.   On: 06/20/2021 13:49   CT ABDOMEN PELVIS W CONTRAST  Result Date: 06/19/2021 CLINICAL DATA:  Nausea vomiting with abdominal pain. EXAM: CT ABDOMEN AND PELVIS WITH CONTRAST TECHNIQUE: Multidetector CT imaging of the abdomen and pelvis was performed using the standard protocol following bolus administration of intravenous contrast. RADIATION DOSE REDUCTION: This exam was performed according to the departmental dose-optimization program which includes automated exposure control, adjustment of the mA and/or kV according to patient size and/or use of iterative reconstruction technique. CONTRAST:  173mL OMNIPAQUE IOHEXOL 300 MG/ML  SOLN COMPARISON:  None. FINDINGS: Lower chest: Unremarkable. Hepatobiliary: No suspicious focal abnormality within the liver parenchyma. There is no evidence for gallstones, gallbladder wall thickening, or pericholecystic fluid. No intrahepatic or extrahepatic biliary dilation. Pancreas: No focal mass lesion. No dilatation of the main duct. No intraparenchymal cyst. No peripancreatic edema. Spleen: No splenomegaly. No focal mass lesion. Adrenals/Urinary Tract: No adrenal nodule or mass. Kidneys unremarkable. No evidence for hydroureter. The urinary bladder appears normal for the degree of distention. Stomach/Bowel: Stomach is  unremarkable. No gastric wall thickening. No evidence of outlet obstruction. Duodenum is normally positioned as is the ligament of Treitz. Small bowel in the left upper quadrant and central abdomen is dilated and fluid-filled measuring up to 4.8 cm diameter. Fecalization of enteric contents noted in the left abdominal loop (axial image 49/3) with rapid tapering into a nondilated loop (axial 47/3) this appears to be in the region of the distal jejunum. Ileal loops are decompressed. Terminal ileum  unremarkable. The appendix is normal. Colon is diffusely decompressed. Vascular/Lymphatic: No abdominal aortic aneurysm. No abdominal aortic atherosclerotic calcification. There is no gastrohepatic or hepatoduodenal ligament lymphadenopathy. No retroperitoneal or mesenteric lymphadenopathy. No pelvic sidewall lymphadenopathy. Reproductive: The prostate gland and seminal vesicles are unremarkable. Other: Multiple well-defined homogeneous collections of loculated fluid or low-density soft tissue are identified throughout the peritoneal cavity. 4.2 x 3.1 cm lesion identified in the lesser sac on image 19/series 3. 4.7 x 1.7 cm collection identified anterior to the liver on image 11/series 3. 3.7 x 1.8 cm collection identified anterior to the inferior vena cava on 43/3. Several omental collections are evident. Material is identified in the left paracolic gutter (images 81-27 of series 3. Small volume free fluid noted in the posterior pelvis. Musculoskeletal: No worrisome lytic or sclerotic osseous abnormality. IMPRESSION: 1. Multiple well-defined homogeneous collections of loculated fluid or low-density soft tissue throughout the peritoneal cavity and in the anterior right juxta. Imaging features are nonspecific but raise the question of peritoneal carcinomatosis. PET-CT may prove helpful to further evaluate. Ultimately, tissue sampling likely warranted. No appendix is visible on the study in the patient may be status post appendectomy. If so, correlation with pathology report from appendectomy suggested. 2. Small bowel obstruction with transition point likely in the region of the distal jejunum. 3. Small volume free fluid in the posterior pelvis. Electronically Signed   By: Misty Stanley M.D.   On: 06/19/2021 06:22   DG Abd Portable 1V-Small Bowel Obstruction Protocol-initial, 8 hr delay  Result Date: 06/19/2021 CLINICAL DATA:  Small-bowel obstruction EXAM: PORTABLE ABDOMEN - 1 VIEW COMPARISON:  06/19/2021  FINDINGS: Two supine frontal views of the abdomen and pelvis demonstrate oral contrast throughout the colon. Persistent nonspecific gaseous distention of the small bowel within the central abdomen, decreasing caliber since prior CT. No abdominal masses. No acute bony abnormalities. IMPRESSION: 1. Transit of oral contrast into the colon, excluding high-grade obstruction. 2. Nonspecific gaseous distention of the small bowel within the central abdomen, decreased in caliber since recent CT. Electronically Signed   By: Randa Ngo M.D.   On: 06/19/2021 22:46    DISCHARGE EXAMINATION: Vitals:   06/20/21 1610 06/20/21 2156 06/21/21 0518 06/21/21 0900  BP: 137/90 (!) 137/97 123/80 134/87  Pulse: 72 (!) 56 (!) 59 62  Resp: 16 16 16 17   Temp: 98.6 F (37 C) 98.4 F (36.9 C) 98.3 F (36.8 C) 97.9 F (36.6 C)  TempSrc: Oral Oral Oral Oral  SpO2: 98% 99% 95% 98%   General appearance: Awake alert.  In no distress Resp: Clear to auscultation bilaterally.  Normal effort Cardio: S1-S2 is normal regular.  No S3-S4.  No rubs murmurs or bruit GI: Abdomen is soft.  Nontender nondistended.  Bowel sounds are present normal.  No masses organomegaly   DISPOSITION: Home  Discharge Instructions     Call MD for:  difficulty breathing, headache or visual disturbances   Complete by: As directed    Call MD for:  extreme fatigue   Complete by: As  directed    Call MD for:  persistant dizziness or light-headedness   Complete by: As directed    Call MD for:  persistant nausea and vomiting   Complete by: As directed    Call MD for:  severe uncontrolled pain   Complete by: As directed    Call MD for:  temperature >100.4   Complete by: As directed    Discharge instructions   Complete by: As directed    Please be sure to follow-up with your primary care provider.  Referral has been sent to the rapid diagnosis clinic with medical oncology, and you should expect to hear from them by next week.  Please seek  attention if your symptoms recur.  Eat a soft diet for the next 1 week.  You were cared for by a hospitalist during your hospital stay. If you have any questions about your discharge medications or the care you received while you were in the hospital after you are discharged, you can call the unit and asked to speak with the hospitalist on call if the hospitalist that took care of you is not available. Once you are discharged, your primary care physician will handle any further medical issues. Please note that NO REFILLS for any discharge medications will be authorized once you are discharged, as it is imperative that you return to your primary care physician (or establish a relationship with a primary care physician if you do not have one) for your aftercare needs so that they can reassess your need for medications and monitor your lab values. If you do not have a primary care physician, you can call [phone] for a physician referral.   Increase activity slowly   Complete by: As directed           Allergies as of 06/21/2021   No Known Allergies      Medication List     TAKE these medications    carbamazepine 400 MG 12 hr tablet Commonly known as: TEGRETOL XR Take 400 mg by mouth 2 (two) times daily.   omeprazole 20 MG capsule Commonly known as: PRILOSEC Take 20 mg by mouth daily as needed (acid reflux).   ondansetron 4 MG tablet Commonly known as: ZOFRAN Take 1 tablet (4 mg total) by mouth every 8 (eight) hours as needed for nausea or vomiting.   polyethylene glycol 17 g packet Commonly known as: MIRALAX / GLYCOLAX Take 17 g by mouth daily as needed for mild constipation.   sennosides-docusate sodium 8.6-50 MG tablet Commonly known as: SENOKOT-S Take 1 tablet by mouth daily as needed for constipation.          Follow-up Information     Vernie Shanks, MD. Schedule an appointment as soon as possible for a visit in 1 week(s).   Specialty: Family Medicine Contact  information: [place] [address] [phone]                 TOTAL DISCHARGE TIME: 49 minutes  [place]  [facility] Pager on [email or web address]  06/22/2021, 12:21 PM

## 2021-06-22 NOTE — Progress Notes (Unsigned)
Oncology Discharge Planning Note ? ?[facility] at [facility] ?Address: [place], [place], [place] [address] ?Hours of Operation:  8am - 5pm, Monday - Friday  ?Clinic Contact Information:  [phone]) [phone] ? ?Oncology Care Team: ?Medical Oncologist:  F/U Kyle Sharp - NP ? ?Patient Details: ?Name:  Kyle Sharp, Kyle Sharp ?MRN:   [id number] ?DOB:   1980-07-24 ?Reason for Current Admission: @PPROB @ ? ?Discharge Planning Narrative: ?Discharge follow-up appointments for oncology are current and available on the AVS and MyChart.   ?Upon discharge from the hospital, hematology/oncology's post discharge plan of care for the outpatient setting is: 06/26/21 with Labs and appointment with Kyle Sharp - NP.  ? ?Kyle Sharp will be called within two business days after discharge to review hematology/oncology's plan of care for full understanding.   ? ?Outpatient Oncology Specific Care Only: ?Oncology appointment transportation needs addressed?:  not applicable ?Oncology medication management for symptom management addressed?:  not applicable ?Chemo Alert Card reviewed?:  not applicable ?Immunotherapy Alert Card reviewed?:  not applicable ?

## 2021-06-25 DIAGNOSIS — Z5181 Encounter for therapeutic drug level monitoring: Secondary | ICD-10-CM | POA: Diagnosis not present

## 2021-06-25 DIAGNOSIS — G40909 Epilepsy, unspecified, not intractable, without status epilepticus: Secondary | ICD-10-CM | POA: Diagnosis not present

## 2021-06-25 DIAGNOSIS — K56609 Unspecified intestinal obstruction, unspecified as to partial versus complete obstruction: Secondary | ICD-10-CM | POA: Diagnosis not present

## 2021-06-25 DIAGNOSIS — E7800 Pure hypercholesterolemia, unspecified: Secondary | ICD-10-CM | POA: Diagnosis not present

## 2021-06-25 DIAGNOSIS — R935 Abnormal findings on diagnostic imaging of other abdominal regions, including retroperitoneum: Secondary | ICD-10-CM | POA: Diagnosis not present

## 2021-06-25 NOTE — Progress Notes (Signed)
[place] [phone]   [phone]) [phone]  INITIAL CONSULTATION:  Patient Care Team: Vernie Shanks, MD as PCP - General (Family Medicine)  HISTORY OF PRESENTING ILLNESS:  Kyle Sharp 41 y.o. male with medical history significant for seizure disorder and small bowel obstruction status post exploratory surgeries at the age of 62 and 59. He presents to the diagnostic clinic for further work-up for abnormal CT imaging that was obtained from recent diagnosis from 06/19/2021-06/21/2021.  On review of the previous records, Kyle Sharp presented to the ED on 06/19/2021 due to left upper quadrant abdominal pain.  CT scan of the abdomen pelvis that showed small bowel obstruction with transition point likely in the region of the distal jejunum.  Additionally there was evidence of multiple well-defined homogenous collections of loculated fluid or low-density soft tissue throughout the peritoneal cavity and in the anterior right juxta.  Patient small bowel obstruction was treated conservatively and symptoms improved on its own.  Tumor markers including PSA, CA 19-9 and CEA were obtained which were negative.  Interventional radiology felt there was not a target lesion amenable for biopsy.  General surgery did not recommend diagnostic laparoscopy for a surgical biopsy.   On exam today, Kyle Sharp is slowly recovering from his recent hospitalization.  His energy levels and appetite are improving and nearly back to baseline.  He has some mild abdominal discomfort but he has a bowel movements.  His stools are soft in consistency without any melena or hematochezia.  Patient denies any nausea or vomiting episodes.  He denies fevers, chills, night sweats, shortness of breath, chest pain or cough.  He has no other complaints.  Rest of the 10 point ROS is below  MEDICAL HISTORY:  Past Medical History:  Diagnosis Date   SBO (small bowel obstruction) (HCC)    Seizure  disorder (HCC)     SURGICAL HISTORY: Past Surgical History:  Procedure Laterality Date   EXPLORATORY LAPAROTOMY     At the age of 27 and at the age of 78    SOCIAL HISTORY: Social History   Socioeconomic History   Marital status: Married    Spouse name: Not on file   Number of children: Not on file   Years of education: Not on file   Highest education level: Not on file  Occupational History   Not on file  Tobacco Use   Smoking status: Never   Smokeless tobacco: Never  Substance and Sexual Activity   Alcohol use: Yes    Comment: Occasional   Drug use: Never   Sexual activity: Yes  Other Topics Concern   Not on file  Social History Narrative   Not on file   Social Determinants of Health   Financial Resource Strain: Not on file  Food Insecurity: Not on file  Transportation Needs: Not on file  Physical Activity: Not on file  Stress: Not on file  Social Connections: Not on file  Intimate Partner Violence: Not on file    FAMILY HISTORY: Family History  Problem Relation Age of Onset   Heart failure Mother    Uterine cancer Paternal Aunt    Heart attack Paternal Aunt     ALLERGIES:  has No Known Allergies.  MEDICATIONS:  Current Outpatient Medications  Medication Sig Dispense Refill   carbamazepine (TEGRETOL XR) 400 MG 12 hr tablet Take 400 mg by mouth 2 (two) times daily.     omeprazole (PRILOSEC) 20 MG capsule Take 20 mg  by mouth daily as needed (acid reflux).     ondansetron (ZOFRAN) 4 MG tablet Take 1 tablet (4 mg total) by mouth every 8 (eight) hours as needed for nausea or vomiting. 20 tablet 0   polyethylene glycol (MIRALAX / GLYCOLAX) 17 g packet Take 17 g by mouth daily as needed for mild constipation.     sennosides-docusate sodium (SENOKOT-S) 8.6-50 MG tablet Take 1 tablet by mouth daily as needed for constipation.     No current facility-administered medications for this visit.    REVIEW OF SYSTEMS:   Constitutional: ( - ) fevers, ( - )  chills  , ( - ) night sweats Eyes: ( - ) blurriness of vision, ( - ) double vision, ( - ) watery eyes Ears, nose, mouth, throat, and face: ( - ) mucositis, ( - ) sore throat Respiratory: ( - ) cough, ( - ) dyspnea, ( - ) wheezes Cardiovascular: ( - ) palpitation, ( - ) chest discomfort, ( - ) lower extremity swelling Gastrointestinal:  ( - ) nausea, ( - ) heartburn, ( - ) change in bowel habits Skin: ( - ) abnormal skin rashes Lymphatics: ( - ) new lymphadenopathy, ( - ) easy bruising Neurological: ( - ) numbness, ( - ) tingling, ( - ) new weaknesses Behavioral/Psych: ( - ) mood change, ( - ) new changes  All other systems were reviewed with the patient and are negative.  PHYSICAL EXAMINATION: ECOG PERFORMANCE STATUS: 1 - Symptomatic but completely ambulatory  Vitals:   06/26/21 1210  BP: 120/79  Pulse: 93  Resp: 20  Temp: 97.9 F (36.6 C)  SpO2: 99%   Filed Weights   06/26/21 1210  Weight: 232 lb 9.6 oz (105.5 kg)    GENERAL: well appearing male in NAD  SKIN: skin color, texture, turgor are normal, no rashes or significant lesions EYES: conjunctiva are pink and non-injected, sclera clear OROPHARYNX: no exudate, no erythema; lips, buccal mucosa, and tongue normal  LYMPH:  no palpable lymphadenopathy in the cervical or supraclavicular lymph nodes.  LUNGS: clear to auscultation and percussion with normal breathing effort HEART: regular rate & rhythm and no murmurs and no lower extremity edema ABDOMEN: soft, non-tender, non-distended, normal bowel sounds Musculoskeletal: no cyanosis of digits and no clubbing  PSYCH: alert & oriented x 3, fluent speech NEURO: no focal motor/sensory deficits  LABORATORY DATA:  I have reviewed the data as listed CBC Latest Ref Rng & Units 06/26/2021 06/21/2021 06/20/2021  WBC 4.0 - 10.5 K/uL 5.4 3.7(L) 3.6(L)  Hemoglobin 13.0 - 17.0 g/dL 13.8 12.8(L) 12.7(L)  Hematocrit 39.0 - 52.0 % 40.8 36.5(L) 38.0(L)  Platelets 150 - 400 K/uL 230 178 199    CMP  Latest Ref Rng & Units 06/26/2021 06/21/2021 06/20/2021  Glucose 70 - 99 mg/dL 87 87 81  BUN 6 - 20 mg/dL '11 9 15  '$ Creatinine 0.61 - 1.24 mg/dL 1.08 1.05 1.05  Sodium 135 - 145 mmol/L 137 138 137  Potassium 3.5 - 5.1 mmol/L 4.1 3.9 3.7  Chloride 98 - 111 mmol/L 100 101 104  CO2 22 - 32 mmol/L '30 28 26  '$ Calcium 8.9 - 10.3 mg/dL 9.9 8.9 8.6(L)  Total Protein 6.5 - 8.1 g/dL 8.7(H) 7.2 -  Total Bilirubin 0.3 - 1.2 mg/dL 0.4 0.8 -  Alkaline Phos 38 - 126 U/L 113 86 -  AST 15 - 41 U/L 32 25 -  ALT 0 - 44 U/L 26 21 -     RADIOGRAPHIC  STUDIES: I have personally reviewed the radiological images as listed and agreed with the findings in the report. CT CHEST W CONTRAST  Result Date: 06/20/2021 CLINICAL DATA:  Evaluate for occult malignancy. EXAM: CT CHEST WITH CONTRAST TECHNIQUE: Multidetector CT imaging of the chest was performed during intravenous contrast administration. RADIATION DOSE REDUCTION: This exam was performed according to the departmental dose-optimization program which includes automated exposure control, adjustment of the mA and/or kV according to patient size and/or use of iterative reconstruction technique. CONTRAST:  63m OMNIPAQUE IOHEXOL 300 MG/ML  SOLN COMPARISON:  CT abdomen 06/19/2021 FINDINGS: Cardiovascular: Normal caliber of the thoracic aorta. Heart size is normal. No pericardial effusion. Mediastinum/Nodes: Again noted are multiple low-density nodular structures in the anterior cardiophrenic region particularly anterior to the liver. These are suspicious for low-density soft tissue nodules. Largest nodule is anterior to the liver and measures 3.7 x 2.2 x 2.2 cm on sequence 3 image 124. Remainder of the mediastinum appears normal. No hilar lymph node enlargement. No axillary lymph node enlargement. No enlarged lymph nodes in the supraclavicular region. Thyroid tissue is unremarkable. Normal appearance of the esophagus. Lungs/Pleura: Trachea and mainstem bronchi are patent. No pleural  effusions. No airspace disease or lung consolidation. No suspicious pulmonary nodules. Few dependent densities in the posterior left lower lobe are suggestive for atelectasis or mild scarring. Upper Abdomen: Again noted are low-density nodules or lymph nodes in the gastrohepatic ligament, largest measures 3.4 cm on sequence 3, image 147. Small low-density nodules in the right anterior peritoneal cavity near the colon. Soft tissue nodule in the anterior abdomen on sequence 3 image 167 measuring 2.5 cm. Small bowel distension has decreased. Again noted is a prominent soft tissue nodule just anterior to the IVC on sequence 3 image 204. Normal appearance of the liver and gallbladder. Abnormal soft tissue along the medial aspect of the spleen. Musculoskeletal: No acute bone abnormality. No suspicious osseous lesions. IMPRESSION: 1. No suspicious pulmonary nodules. No evidence for a primary lung neoplasm. 2. Multiple abnormal low-density nodules throughout the abdomen and in the cardiophrenic spaces. Findings are similar to the recent abdominal CT and suggestive for metastatic disease. 3. Decreased small bowel distension since 06/19/2021. Electronically Signed   By: AMarkus DaftM.D.   On: 06/20/2021 13:49   CT ABDOMEN PELVIS W CONTRAST  Result Date: 06/19/2021 CLINICAL DATA:  Nausea vomiting with abdominal pain. EXAM: CT ABDOMEN AND PELVIS WITH CONTRAST TECHNIQUE: Multidetector CT imaging of the abdomen and pelvis was performed using the standard protocol following bolus administration of intravenous contrast. RADIATION DOSE REDUCTION: This exam was performed according to the departmental dose-optimization program which includes automated exposure control, adjustment of the mA and/or kV according to patient size and/or use of iterative reconstruction technique. CONTRAST:  1056mOMNIPAQUE IOHEXOL 300 MG/ML  SOLN COMPARISON:  None. FINDINGS: Lower chest: Unremarkable. Hepatobiliary: No suspicious focal abnormality within  the liver parenchyma. There is no evidence for gallstones, gallbladder wall thickening, or pericholecystic fluid. No intrahepatic or extrahepatic biliary dilation. Pancreas: No focal mass lesion. No dilatation of the main duct. No intraparenchymal cyst. No peripancreatic edema. Spleen: No splenomegaly. No focal mass lesion. Adrenals/Urinary Tract: No adrenal nodule or mass. Kidneys unremarkable. No evidence for hydroureter. The urinary bladder appears normal for the degree of distention. Stomach/Bowel: Stomach is unremarkable. No gastric wall thickening. No evidence of outlet obstruction. Duodenum is normally positioned as is the ligament of Treitz. Small bowel in the left upper quadrant and central abdomen is dilated and fluid-filled measuring up  to 4.8 cm diameter. Fecalization of enteric contents noted in the left abdominal loop (axial image 49/3) with rapid tapering into a nondilated loop (axial 47/3) this appears to be in the region of the distal jejunum. Ileal loops are decompressed. Terminal ileum unremarkable. The appendix is normal. Colon is diffusely decompressed. Vascular/Lymphatic: No abdominal aortic aneurysm. No abdominal aortic atherosclerotic calcification. There is no gastrohepatic or hepatoduodenal ligament lymphadenopathy. No retroperitoneal or mesenteric lymphadenopathy. No pelvic sidewall lymphadenopathy. Reproductive: The prostate gland and seminal vesicles are unremarkable. Other: Multiple well-defined homogeneous collections of loculated fluid or low-density soft tissue are identified throughout the peritoneal cavity. 4.2 x 3.1 cm lesion identified in the lesser sac on image 19/series 3. 4.7 x 1.7 cm collection identified anterior to the liver on image 11/series 3. 3.7 x 1.8 cm collection identified anterior to the inferior vena cava on 43/3. Several omental collections are evident. Material is identified in the left paracolic gutter (images 37-16 of series 3. Small volume free fluid noted in  the posterior pelvis. Musculoskeletal: No worrisome lytic or sclerotic osseous abnormality. IMPRESSION: 1. Multiple well-defined homogeneous collections of loculated fluid or low-density soft tissue throughout the peritoneal cavity and in the anterior right juxta. Imaging features are nonspecific but raise the question of peritoneal carcinomatosis. PET-CT may prove helpful to further evaluate. Ultimately, tissue sampling likely warranted. No appendix is visible on the study in the patient may be status post appendectomy. If so, correlation with pathology report from appendectomy suggested. 2. Small bowel obstruction with transition point likely in the region of the distal jejunum. 3. Small volume free fluid in the posterior pelvis. Electronically Signed   By: Misty Stanley M.D.   On: 06/19/2021 06:22   DG Abd Portable 1V-Small Bowel Obstruction Protocol-initial, 8 hr delay  Result Date: 06/19/2021 CLINICAL DATA:  Small-bowel obstruction EXAM: PORTABLE ABDOMEN - 1 VIEW COMPARISON:  06/19/2021 FINDINGS: Two supine frontal views of the abdomen and pelvis demonstrate oral contrast throughout the colon. Persistent nonspecific gaseous distention of the small bowel within the central abdomen, decreasing caliber since prior CT. No abdominal masses. No acute bony abnormalities. IMPRESSION: 1. Transit of oral contrast into the colon, excluding high-grade obstruction. 2. Nonspecific gaseous distention of the small bowel within the central abdomen, decreased in caliber since recent CT. Electronically Signed   By: Randa Ngo M.D.   On: 06/19/2021 22:46    ASSESSMENT & PLAN Kyle Sharp is a 41 y.o. male who presents to the diagnostic clinic for evaluation for multifocal collection of loculated fluid or low-density soft tissue throughout the peritoneal cavity.  This was found when patient was recently hospitalized for small bowel obstruction.  Patient reports history of numerous episodes of small bowel obstruction  as a child as young as the age of 5.  He underwent exploratory surgery at the age of 56 and 28.  We are awaiting to review the medical records to confirm this.   We reviewed the recent CT imaging in detail and it is unclear the etiology but differentials include infectious process, benign tumors and underlying malignancy.  We recommend a PET scan to further evaluate the peritoneal findings.  Ultimately a tissue sample will confirm the diagnosis.  We will reach out to our colleagues in interventional radiology to reevaluate a percutaneous biopsy of the nodularity seen anterior to the liver.  Another option is to target the perigastric lesion via endoscopic ultrasound.  #Peritoneal lesions: --Etiology unknown but suspect benign etiology due to history recurrent bowel obstruction and imaging  characteristics --Recommend PET scan to further evaluate  --Need tissue sampling to confirm diagnosis.  --RTC once workup is complete.    Orders Placed This Encounter  Procedures   NM PET Image Initial (PI) Skull Base To Thigh    Standing Status:   Future    Standing Expiration Date:   06/26/2022    Order Specific Question:   If indicated for the ordered procedure, I authorize the administration of a radiopharmaceutical per Radiology protocol    Answer:   Yes    Order Specific Question:   Preferred imaging location?    Answer:   [facility]   CBC with Differential (Cancer Center Only)    Standing Status:   Future    Number of Occurrences:   1    Standing Expiration Date:   06/27/2022   CMP ([facility] only)    Standing Status:   Future    Number of Occurrences:   1    Standing Expiration Date:   06/27/2022   Lactate dehydrogenase (LDH)    Standing Status:   Future    Number of Occurrences:   1    Standing Expiration Date:   06/26/2022   CA 125    Standing Status:   Future    Number of Occurrences:   1    Standing Expiration Date:   06/26/2022    All questions were answered. The patient knows to call  the clinic with any problems, questions or concerns.  I have spent a total of 60 minutes minutes of face-to-face and non-face-to-face time, preparing to see the patient, obtaining and/or reviewing separately obtained history, performing a medically appropriate examination, counseling and educating the patient, ordering tests/procedures, communicating with other health care professionals, documenting clinical information in the electronic health record, and care coordination.   Dede Query, PA-C Department of Hematology/Oncology [place] at [facility] Phone: [phone]  Patient was seen with Dr. Lorenso Courier  I have read the above note and personally examined the patient. I agree with the assessment and plan as noted above.  Briefly Kyle Sharp is a 41 year old male who presents for evaluation of nodules in the abdomen concerning for peritoneal carcinomatosis.  At this time the etiology is unclear though given the smooth appearance and chronicity of these findings would support a benign cause.  In order to confirm our suspicions would recommend pursuing a biopsy.  There appear to be numerous lesions which may be amenable to biopsy via IR a possible gastroenterology intervention.  We will work on obtaining a tissue sample and plan to see the patient back after a pathology review.   Kyle Peoples, MD Department of Hematology/Oncology [place] at [facility] Phone: [phone] Pager: [phone] Email: [email or web address]

## 2021-06-26 ENCOUNTER — Inpatient Hospital Stay: Payer: BC Managed Care – PPO | Attending: Physician Assistant | Admitting: Physician Assistant

## 2021-06-26 ENCOUNTER — Telehealth (HOSPITAL_COMMUNITY): Payer: Self-pay | Admitting: *Deleted

## 2021-06-26 ENCOUNTER — Inpatient Hospital Stay: Payer: BC Managed Care – PPO

## 2021-06-26 ENCOUNTER — Other Ambulatory Visit: Payer: Self-pay

## 2021-06-26 ENCOUNTER — Encounter: Payer: Self-pay | Admitting: Physician Assistant

## 2021-06-26 VITALS — BP 120/79 | HR 93 | Temp 97.9°F | Resp 20 | Wt 232.6 lb

## 2021-06-26 DIAGNOSIS — Z79899 Other long term (current) drug therapy: Secondary | ICD-10-CM | POA: Insufficient documentation

## 2021-06-26 DIAGNOSIS — G40909 Epilepsy, unspecified, not intractable, without status epilepticus: Secondary | ICD-10-CM | POA: Insufficient documentation

## 2021-06-26 DIAGNOSIS — R935 Abnormal findings on diagnostic imaging of other abdominal regions, including retroperitoneum: Secondary | ICD-10-CM | POA: Insufficient documentation

## 2021-06-26 DIAGNOSIS — K669 Disorder of peritoneum, unspecified: Secondary | ICD-10-CM

## 2021-06-26 LAB — CBC WITH DIFFERENTIAL (CANCER CENTER ONLY)
Abs Immature Granulocytes: 0.01 10*3/uL (ref 0.00–0.07)
Basophils Absolute: 0 10*3/uL (ref 0.0–0.1)
Basophils Relative: 1 %
Eosinophils Absolute: 0.2 10*3/uL (ref 0.0–0.5)
Eosinophils Relative: 4 %
HCT: 40.8 % (ref 39.0–52.0)
Hemoglobin: 13.8 g/dL (ref 13.0–17.0)
Immature Granulocytes: 0 %
Lymphocytes Relative: 28 %
Lymphs Abs: 1.5 10*3/uL (ref 0.7–4.0)
MCH: 28 pg (ref 26.0–34.0)
MCHC: 33.8 g/dL (ref 30.0–36.0)
MCV: 82.9 fL (ref 80.0–100.0)
Monocytes Absolute: 0.5 10*3/uL (ref 0.1–1.0)
Monocytes Relative: 10 %
Neutro Abs: 3.1 10*3/uL (ref 1.7–7.7)
Neutrophils Relative %: 57 %
Platelet Count: 230 10*3/uL (ref 150–400)
RBC: 4.92 MIL/uL (ref 4.22–5.81)
RDW: 13.2 % (ref 11.5–15.5)
WBC Count: 5.4 10*3/uL (ref 4.0–10.5)
nRBC: 0 % (ref 0.0–0.2)

## 2021-06-26 LAB — CMP (CANCER CENTER ONLY)
ALT: 26 U/L (ref 0–44)
AST: 32 U/L (ref 15–41)
Albumin: 4.7 g/dL (ref 3.5–5.0)
Alkaline Phosphatase: 113 U/L (ref 38–126)
Anion gap: 7 (ref 5–15)
BUN: 11 mg/dL (ref 6–20)
CO2: 30 mmol/L (ref 22–32)
Calcium: 9.9 mg/dL (ref 8.9–10.3)
Chloride: 100 mmol/L (ref 98–111)
Creatinine: 1.08 mg/dL (ref 0.61–1.24)
GFR, Estimated: >60 mL/min (ref >60)
Glucose, Bld: 87 mg/dL (ref 70–99)
Potassium: 4.1 mmol/L (ref 3.5–5.1)
Sodium: 137 mmol/L (ref 135–145)
Total Bilirubin: 0.4 mg/dL (ref 0.3–1.2)
Total Protein: 8.7 g/dL — ABNORMAL HIGH (ref 6.5–8.1)

## 2021-06-26 LAB — LACTATE DEHYDROGENASE: LDH: 196 U/L — ABNORMAL HIGH (ref 98–192)

## 2021-06-26 NOTE — Telephone Encounter (Signed)
Attempted to contact patient for hospital follow up.  Unable to leave message.  Has follow up today. ?

## 2021-06-27 LAB — CA 125: Cancer Antigen (CA) 125: 7.9 U/mL

## 2021-06-28 ENCOUNTER — Inpatient Hospital Stay
Admission: RE | Admit: 2021-06-28 | Discharge: 2021-06-28 | Disposition: A | Discharge status: Home / Self Care | Payer: Self-pay | Source: Ambulatory Visit | Attending: Physician Assistant | Admitting: Physician Assistant

## 2021-06-28 ENCOUNTER — Other Ambulatory Visit (HOSPITAL_COMMUNITY): Payer: Self-pay | Admitting: Physician Assistant

## 2021-06-28 DIAGNOSIS — C801 Malignant (primary) neoplasm, unspecified: Secondary | ICD-10-CM

## 2021-07-02 ENCOUNTER — Other Ambulatory Visit: Payer: Self-pay | Admitting: Physician Assistant

## 2021-07-04 ENCOUNTER — Telehealth: Payer: Self-pay

## 2021-07-04 NOTE — Telephone Encounter (Signed)
-----   Message from Irving Copas., MD sent at 06/29/2021  3:09 PM EST ----- ?Regarding: RE: Biopsy request ?IT and JD, ?Thanks for reaching out. ?The only potentially accessible region would be the lesion in the lesser sac.  The other 2 that are demarcated will not be able to be accessed by EUS.  With this being said, it does look to be more of a fluid consistency rather than masslike consistency.  I do think the PET scan will be very helpful.  What I will do is try to get the patient set up for a EGD/EUS in the next few weeks with you guys forwarding [place] the results of the PET scan.  If this is okay, then please reply back and my nurse can work on trying to schedule this in the coming weeks. ?Thanks. ?GM ? ?Yavonne Kiss,  ?we will wait further discussion/follow-up from the team to know that the patient is aware that an EUS may be offered to him.  We will then look towards my hospital week to try and add on this EUS at the end of the month. ?Thanks. ?GM ? ?----- Message ----- ?From: Lincoln Brigham, PA-C ?Sent: 06/28/2021   9:53 AM EST ?To: Irving Copas., MD, # ?Subject: Biopsy request                                ? ?Dr. Rush Landmark,  ? ?Dr. Lorenso Courier and I saw Kyle Sharp in our outpatient diagnostic clinic today to evaluate the peritoneal lesions that were recently seen on CT imaging. We have requested a PET scan next week to further evaluate but ultimately we recommend tissue sampling to confirm the underlying etiology. We wanted to see if you can review the case and determine if the peri-gastric mass would be amenable to endoscopic biopsy.  ? ?Thank you, ?Murray Hodgkins ? ? ?

## 2021-07-05 ENCOUNTER — Other Ambulatory Visit: Payer: Self-pay

## 2021-07-05 ENCOUNTER — Ambulatory Visit (HOSPITAL_COMMUNITY)
Admission: RE | Admit: 2021-07-05 | Discharge: 2021-07-05 | Disposition: A | Discharge status: Home / Self Care | Payer: BC Managed Care – PPO | Source: Ambulatory Visit | Attending: Physician Assistant | Admitting: Physician Assistant

## 2021-07-05 DIAGNOSIS — R918 Other nonspecific abnormal finding of lung field: Secondary | ICD-10-CM | POA: Diagnosis not present

## 2021-07-05 DIAGNOSIS — R188 Other ascites: Secondary | ICD-10-CM | POA: Diagnosis not present

## 2021-07-05 DIAGNOSIS — K669 Disorder of peritoneum, unspecified: Secondary | ICD-10-CM | POA: Diagnosis not present

## 2021-07-05 DIAGNOSIS — K6389 Other specified diseases of intestine: Secondary | ICD-10-CM | POA: Diagnosis not present

## 2021-07-05 DIAGNOSIS — E041 Nontoxic single thyroid nodule: Secondary | ICD-10-CM | POA: Diagnosis not present

## 2021-07-05 LAB — GLUCOSE, CAPILLARY: Glucose-Capillary: 94 mg/dL (ref 70–99)

## 2021-07-05 MED ORDER — FLUDEOXYGLUCOSE F - 18 (FDG) INJECTION
11.6000 | Freq: Once | INTRAVENOUS | Status: AC
  Administered 2021-07-05: 11.51 via INTRAVENOUS

## 2021-07-09 ENCOUNTER — Telehealth: Payer: Self-pay | Admitting: Physician Assistant

## 2021-07-09 ENCOUNTER — Telehealth: Payer: Self-pay | Admitting: Hematology and Oncology

## 2021-07-09 DIAGNOSIS — K669 Disorder of peritoneum, unspecified: Secondary | ICD-10-CM

## 2021-07-09 DIAGNOSIS — E041 Nontoxic single thyroid nodule: Secondary | ICD-10-CM

## 2021-07-09 NOTE — Telephone Encounter (Signed)
Mansouraty, Telford Nab., MD  Milus Banister, MD; Cordelia Poche; Orson Slick, MD; Timothy Lasso, RN ?DJ,  ?Thanks for your reply as well.  ? ?ITT and JD,  ?At this point and with this imaging we would hold on endoscopic ultrasound evaluation.  He should follow-up with surgery.  If there remains issues/concerns after follow-up then further discussion/evaluation can be brought up at that point.  ?Thanks.  ? ?FYI Kyle Sharp.  ?GM   ?  ?   ? ?

## 2021-07-09 NOTE — Telephone Encounter (Signed)
Scheduled per 3/20 secure chat w/ Murray Hodgkins, pt has been called and confirmed  ?

## 2021-07-09 NOTE — Telephone Encounter (Signed)
I called [name] to review the PET scan results and discuss recommendations regarding possible biopsy. Findings from the PET scan show no evidence of hypermetabolic activity. The nodules involving the cardiophrenic angles and lesser sac are fluid filled consistent with benign findings. Additionally, these findings have been present since February 2014 CT scan. Discussed the role further diagnostic studies including EUS and tissue sampling. Both interventional radiology and GI teams agreed not to pursue biopsy as FNA can introduce risk of infection. Additionally, all tumor markers were negative and remaining labs were unremarkable. Recommend to monitor findings with repeat CT imaging in 3-4 months to confirm stability. Patient has requested to follow up with our clinic.  ? ?Additionally, PET scan found a 1.7 cm incidental left thyroid nodule with recommendations for an ultrasound to further evaluate. I will order the thyroid US as well.  ? ?Patient expressed understanding and satisfaction with the plan provided.  ?

## 2021-07-16 ENCOUNTER — Telehealth: Payer: Self-pay | Admitting: *Deleted

## 2021-07-16 NOTE — Telephone Encounter (Signed)
Received vm message from pt's wife asking about a referral to [facility] practice. ?Ms. Littler has nnot heard from them yet. ? ?Please advise ?

## 2021-07-17 ENCOUNTER — Other Ambulatory Visit: Payer: Self-pay | Admitting: Physician Assistant

## 2021-07-17 DIAGNOSIS — K56609 Unspecified intestinal obstruction, unspecified as to partial versus complete obstruction: Secondary | ICD-10-CM

## 2021-07-17 NOTE — Telephone Encounter (Signed)
It was for a colonoscopy. Order is in place. Can you call and help get that scheduled? ?

## 2021-07-18 ENCOUNTER — Encounter: Payer: Self-pay | Admitting: Gastroenterology

## 2021-07-18 ENCOUNTER — Telehealth: Payer: Self-pay

## 2021-07-18 NOTE — Telephone Encounter (Signed)
YF, ?Happy to proceed with colonoscopy.  Would also recommend an upper endoscopy to evaluate his upper GI tract based on his previous symptoms and previous small bowel obstruction and abnormal CT imaging.  I see no contraindication for why he needs to be done in the hospital-based setting unless anesthesia reviews and finds any issues.  He can be scheduled with me next available EGD/colonoscopy double slot.  Thanks. ?GM ?

## 2021-07-18 NOTE — Telephone Encounter (Signed)
Called to schedule, no answer. Left a voicemail, I will try him again in the morning. ?

## 2021-07-18 NOTE — Telephone Encounter (Signed)
Received a referral yesterday for a Colonoscopy. Is this appropriate to schedule or will he need an OV first? Dr Charlies Silvers is asking for this referral to be expedited? ?

## 2021-07-19 ENCOUNTER — Encounter: Payer: Self-pay | Admitting: Gastroenterology

## 2021-07-19 NOTE — Telephone Encounter (Signed)
Called once again to attempt scheduling. No answer, left voicemail to call back. ?

## 2021-08-13 ENCOUNTER — Ambulatory Visit (AMBULATORY_SURGERY_CENTER): Payer: BC Managed Care – PPO | Admitting: *Deleted

## 2021-08-13 VITALS — Ht 74.0 in | Wt 229.0 lb

## 2021-08-13 DIAGNOSIS — K56609 Unspecified intestinal obstruction, unspecified as to partial versus complete obstruction: Secondary | ICD-10-CM

## 2021-08-13 DIAGNOSIS — R9389 Abnormal findings on diagnostic imaging of other specified body structures: Secondary | ICD-10-CM

## 2021-08-13 MED ORDER — NA SULFATE-K SULFATE-MG SULF 17.5-3.13-1.6 GM/177ML PO SOLN: 1.0000 | Freq: Once | ORAL | 0 refills | Status: AC

## 2021-08-13 NOTE — Progress Notes (Signed)
No egg or soy allergy known to patient  ?No issues known to pt with past sedation with any surgeries or procedures ?Patient denies ever being told they had issues or difficulty with intubation  ?No FH of Malignant Hyperthermia ?Pt is not on diet pills ?Pt is not on  home 02  ?Pt is not on blood thinners  ?Pt denies issues with constipation - intermittent - has Miralax and Senokot - hasn't used either since FEB 2023 ?No A fib or A flutter ? ?NO PA's for preps discussed with pt In PV today  ?Discussed with pt there will be an out-of-pocket cost for prep and that varies from $0 to 70 +  dollars - pt verbalized understanding  ?Pt instructed to use Singlecare.com or GoodRx for a price reduction on prep  ? ?PV completed over the phone. Pt verified name, DOB, address and insurance during PV today.  ?  ?Pt encouraged to call with questions or issues.  ?If pt has My chart, procedure instructions sent via My Chart  ?Insurance confirmed with pt at [facility] today  ? ?

## 2021-08-14 ENCOUNTER — Ambulatory Visit: Payer: BC Managed Care – PPO | Admitting: Gastroenterology

## 2021-08-26 ENCOUNTER — Encounter: Payer: Self-pay | Admitting: Certified Registered Nurse Anesthetist

## 2021-08-29 ENCOUNTER — Encounter: Payer: Self-pay | Admitting: Gastroenterology

## 2021-08-29 ENCOUNTER — Ambulatory Visit (AMBULATORY_SURGERY_CENTER): Payer: BC Managed Care – PPO | Admitting: Gastroenterology

## 2021-08-29 VITALS — BP 108/74 | HR 59 | Temp 97.0°F | Resp 18 | Ht 72.0 in | Wt 232.0 lb

## 2021-08-29 DIAGNOSIS — K2970 Gastritis, unspecified, without bleeding: Secondary | ICD-10-CM

## 2021-08-29 DIAGNOSIS — R9389 Abnormal findings on diagnostic imaging of other specified body structures: Secondary | ICD-10-CM | POA: Diagnosis not present

## 2021-08-29 DIAGNOSIS — D123 Benign neoplasm of transverse colon: Secondary | ICD-10-CM

## 2021-08-29 DIAGNOSIS — K449 Diaphragmatic hernia without obstruction or gangrene: Secondary | ICD-10-CM | POA: Diagnosis not present

## 2021-08-29 DIAGNOSIS — K2950 Unspecified chronic gastritis without bleeding: Secondary | ICD-10-CM | POA: Diagnosis not present

## 2021-08-29 DIAGNOSIS — K229 Disease of esophagus, unspecified: Secondary | ICD-10-CM

## 2021-08-29 DIAGNOSIS — D128 Benign neoplasm of rectum: Secondary | ICD-10-CM

## 2021-08-29 DIAGNOSIS — K56609 Unspecified intestinal obstruction, unspecified as to partial versus complete obstruction: Secondary | ICD-10-CM | POA: Diagnosis not present

## 2021-08-29 DIAGNOSIS — B9681 Helicobacter pylori [H. pylori] as the cause of diseases classified elsewhere: Secondary | ICD-10-CM | POA: Diagnosis not present

## 2021-08-29 MED ORDER — SODIUM CHLORIDE 0.9 % IV SOLN: 500.0000 mL | Freq: Once | INTRAVENOUS | Status: DC

## 2021-08-29 NOTE — Progress Notes (Signed)
? ?GASTROENTEROLOGY PROCEDURE H&P NOTE  ? ?Primary Care Physician: ?Vernie Shanks, MD ? ?HPI: ?Kyle Sharp is a 41 y.o. male who presents for EGD/Colonoscopy for evaluation of abdominal pain, history of SBOs, abnormal CT scan and chronic fluid collections. ? ?Past Medical History:  ?Diagnosis Date  ? SBO (small bowel obstruction) ([place]) 06/19/2021  ? Seizure disorder ([place])   ? 08-13-21- per pt 2014  ? ?Past Surgical History:  ?Procedure Laterality Date  ? EXPLORATORY LAPAROTOMY    ? At the age of 1 and at the age of 31  ? ?Current Outpatient Medications  ?Medication Sig Dispense Refill  ? carbamazepine (TEGRETOL XR) 400 MG 12 hr tablet Take 400 mg by mouth 2 (two) times daily.    ? ondansetron (ZOFRAN) 4 MG tablet Take 1 tablet (4 mg total) by mouth every 8 (eight) hours as needed for nausea or vomiting. (Patient not taking: Reported on 08/13/2021) 20 tablet 0  ? polyethylene glycol (MIRALAX / GLYCOLAX) 17 g packet Take 17 g by mouth daily as needed for mild constipation. (Patient not taking: Reported on 08/13/2021)    ? sennosides-docusate sodium (SENOKOT-S) 8.6-50 MG tablet Take 1 tablet by mouth daily as needed for constipation. (Patient not taking: Reported on 08/13/2021)    ? ?Current Facility-Administered Medications  ?Medication Dose Route Frequency Provider Last Rate Last Admin  ? 0.9 %  sodium chloride infusion  500 mL Intravenous Once Mansouraty, Telford Nab., MD      ? ? ?Current Outpatient Medications:  ?  carbamazepine (TEGRETOL XR) 400 MG 12 hr tablet, Take 400 mg by mouth 2 (two) times daily., Disp: , Rfl:  ?  ondansetron (ZOFRAN) 4 MG tablet, Take 1 tablet (4 mg total) by mouth every 8 (eight) hours as needed for nausea or vomiting. (Patient not taking: Reported on 08/13/2021), Disp: 20 tablet, Rfl: 0 ?  polyethylene glycol (MIRALAX / GLYCOLAX) 17 g packet, Take 17 g by mouth daily as needed for mild constipation. (Patient not taking: Reported on 08/13/2021), Disp: , Rfl:  ?  sennosides-docusate sodium  (SENOKOT-S) 8.6-50 MG tablet, Take 1 tablet by mouth daily as needed for constipation. (Patient not taking: Reported on 08/13/2021), Disp: , Rfl:  ? ?Current Facility-Administered Medications:  ?  0.9 %  sodium chloride infusion, 500 mL, Intravenous, Once, Mansouraty, Telford Nab., MD ?No Known Allergies ?Family History  ?Problem Relation Age of Onset  ? Heart failure Mother   ? Uterine cancer Paternal Aunt   ? Heart attack Paternal Aunt   ? Colon cancer Neg Hx   ? Colon polyps Neg Hx   ? Esophageal cancer Neg Hx   ? Rectal cancer Neg Hx   ? Stomach cancer Neg Hx   ? ?Social History  ? ?Socioeconomic History  ? Marital status: Married  ?  Spouse name: Not on file  ? Number of children: Not on file  ? Years of education: Not on file  ? Highest education level: Not on file  ?Occupational History  ? Not on file  ?Tobacco Use  ? Smoking status: Never  ? Smokeless tobacco: Never  ?Substance and Sexual Activity  ? Alcohol use: Yes  ?  Comment: Occasional  ? Drug use: Never  ? Sexual activity: Yes  ?Other Topics Concern  ? Not on file  ?Social History Narrative  ? Not on file  ? ?Social Determinants of Health  ? ?Financial Resource Strain: Not on file  ?Food Insecurity: Not on file  ?Transportation Needs: Not on  file  ?Physical Activity: Not on file  ?Stress: Not on file  ?Social Connections: Not on file  ?Intimate Partner Violence: Not on file  ? ? ?Physical Exam: ?Today's Vitals  ? 08/29/21 0720  ?BP: 135/79  ?Pulse: 63  ?Temp: (!) 97 ?F (36.1 ?C)  ?TempSrc: Temporal  ?SpO2: 100%  ?Weight: 232 lb (105.2 kg)  ?Height: 6' (1.829 m)  ? ?Body mass index is 31.46 kg/m?. ?GEN: NAD ?EYE: Sclerae anicteric ?ENT: MMM ?CV: Non-tachycardic ?GI: Soft, NT/ND ?NEURO:  Alert & Oriented x 3 ? ?Lab Results: ?No results for input(s): WBC, HGB, HCT, PLT in the last 72 hours. ?BMET ?No results for input(s): NA, K, CL, CO2, GLUCOSE, BUN, CREATININE, CALCIUM in the last 72 hours. ?LFT ?No results for input(s): PROT, ALBUMIN, AST, ALT, ALKPHOS,  BILITOT, BILIDIR, IBILI in the last 72 hours. ?PT/INR ?No results for input(s): LABPROT, INR in the last 72 hours. ? ? ?Impression / Plan: ?This is a 41 y.o.male who presents for EGD/Colonoscopy for evaluation of abdominal pain, history of SBOs, abnormal CT scan and chronic fluid collections. ? ?The risks and benefits of endoscopic evaluation/treatment were discussed with the patient and/or family; these include but are not limited to the risk of perforation, infection, bleeding, missed lesions, lack of diagnosis, severe illness requiring hospitalization, as well as anesthesia and sedation related illnesses.  The patient's history has been reviewed, patient examined, no change in status, and deemed stable for procedure.  The patient and/or family is agreeable to proceed.  ? ? ?Justice Britain, MD ?[facility] ?Advanced Endoscopy ?Office # [phone] ? ?

## 2021-08-29 NOTE — Progress Notes (Signed)
0800 Robinul 0.1 mg IV given due large amount of secretions upon assessment.  MD made aware, vss 

## 2021-08-29 NOTE — Progress Notes (Signed)
Called to room to assist during endoscopic procedure.  Patient ID and intended procedure confirmed with present staff. Received instructions for my participation in the procedure from the performing physician.  

## 2021-08-29 NOTE — Patient Instructions (Signed)
?Upper Endoscopy: ? ?Handout on gastritis & hiatal hernia given to you today ? ?Await pathology results on biopsies done  ? ? ?Colonoscopy: ? ? ?Handout on polyps & hemorrhoids & high fiber diet  given to you today ? ?Use Fbercon ( over the counter ) 1-2 tablets daily ? ? ? ?YOU HAD AN ENDOSCOPIC PROCEDURE TODAY AT [facility]:   Refer to the procedure report that was given to you for any specific questions about what was found during the examination.  If the procedure report does not answer your questions, please call your gastroenterologist to clarify.  If you requested that your care partner not be given the details of your procedure findings, then the procedure report has been included in a sealed envelope for you to review at your convenience later. ? ?YOU SHOULD EXPECT: Some feelings of bloating in the abdomen. Passage of more gas than usual.  Walking can help get rid of the air that was put into your GI tract during the procedure and reduce the bloating. If you had a lower endoscopy (such as a colonoscopy or flexible sigmoidoscopy) you may notice spotting of blood in your stool or on the toilet paper. If you underwent a bowel prep for your procedure, you may not have a normal bowel movement for a few days. ? ?Please Note:  You might notice some irritation and congestion in your nose or some drainage.  This is from the oxygen used during your procedure.  There is no need for concern and it should clear up in a day or so. ? ?SYMPTOMS TO REPORT IMMEDIATELY: ? ?Following lower endoscopy (colonoscopy or flexible sigmoidoscopy): ? Excessive amounts of blood in the stool ? Significant tenderness or worsening of abdominal pains ? Swelling of the abdomen that is new, acute ? Fever of 100?F or higher ? ?Following upper endoscopy (EGD) ? Vomiting of blood or coffee ground material ? New chest pain or pain under the shoulder blades ? Painful or persistently difficult swallowing ? New shortness of  breath ? Fever of 100?F or higher ? Black, tarry-looking stools ? ?For urgent or emergent issues, a gastroenterologist can be reached at any hour by calling [phone]. ?Do not use MyChart messaging for urgent concerns.  ? ? ?DIET:  We do recommend a small meal at first, but then you may proceed to your regular diet.  Drink plenty of fluids but you should avoid alcoholic beverages for 24 hours. ? ?ACTIVITY:  You should plan to take it easy for the rest of today and you should NOT DRIVE or use heavy machinery until tomorrow (because of the sedation medicines used during the test).   ? ?FOLLOW UP: ?Our staff will call the number listed on your records 48-72 hours following your procedure to check on you and address any questions or concerns that you may have regarding the information given to you following your procedure. If we do not reach you, we will leave a message.  We will attempt to reach you two times.  During this call, we will ask if you have developed any symptoms of COVID 19. If you develop any symptoms (ie: fever, flu-like symptoms, shortness of breath, cough etc.) before then, please call [phone].  If you test positive for Covid 19 in the 2 weeks post procedure, please call and report this information to [place].   ? ?If any biopsies were taken you will be contacted by phone or by letter within the next 1-3 weeks.  Please call us at [phone] if you have not heard about the biopsies in 3 weeks.  ? ? ?SIGNATURES/CONFIDENTIALITY: ?You and/or your care partner have signed paperwork which will be entered into your electronic medical record.  These signatures attest to the fact that that the information above on your After Visit Summary has been reviewed and is understood.  Full responsibility of the confidentiality of this discharge information lies with you and/or your care-partner.  ? ? ?

## 2021-08-29 NOTE — Op Note (Signed)
[facility] ?Patient Name: Kyle Sharp ?Procedure Date: 08/29/2021 7:22 AM ?MRN: [id number] ?Endoscopist: Justice Britain , MD ?Age: 41 ?Referring MD:  ?Date of Birth: 1980-10-13 ?Gender: Male ?Account #: [id number] ?Procedure:                Colonoscopy ?Indications:              Generalized abdominal pain, Abnormal CT of the GI  ?                          tract, History of bowel obstructions ?Medicines:                Monitored Anesthesia Care ?Procedure:                Pre-Anesthesia Assessment: ?                          - Prior to the procedure, a History and Physical  ?                          was performed, and patient medications and  ?                          allergies were reviewed. The patient's tolerance of  ?                          previous anesthesia was also reviewed. The risks  ?                          and benefits of the procedure and the sedation  ?                          options and risks were discussed with the patient.  ?                          All questions were answered, and informed consent  ?                          was obtained. Prior Anticoagulants: The patient has  ?                          taken no previous anticoagulant or antiplatelet  ?                          agents. ASA Grade Assessment: III - A patient with  ?                          severe systemic disease. After reviewing the risks  ?                          and benefits, the patient was deemed in  ?                          satisfactory condition to undergo the procedure. ?  After obtaining informed consent, the colonoscope  ?                          was passed under direct vision. Throughout the  ?                          procedure, the patient's blood pressure, pulse, and  ?                          oxygen saturations were monitored continuously. The  ?                          Olympus CF-[id number] (#[id number]) Colonoscope was  ?                          introduced through the  anus and advanced to the 5  ?                          cm into the ileum. The colonoscopy was performed  ?                          without difficulty. The patient tolerated the  ?                          procedure. The quality of the bowel preparation was  ?                          adequate. The terminal ileum, ileocecal valve,  ?                          appendiceal orifice, and rectum were photographed. ?Scope In: 8:15:44 AM ?Scope Out: 8:30:08 AM ?Scope Withdrawal Time: 0 hours 10 minutes 38 seconds  ?Total Procedure Duration: 0 hours 14 minutes 24 seconds  ?Findings:                 The digital rectal exam findings include  ?                          hemorrhoids. Pertinent negatives include no  ?                          palpable rectal lesions. ?                          The terminal ileum and ileocecal valve appeared  ?                          normal. ?                          Two sessile polyps were found in the rectum and  ?                          transverse colon. The polyps were 3 to 10 mm in  ?  size. These polyps were removed with a cold snare.  ?                          Resection and retrieval were complete. ?                          Normal mucosa was found in the entire colon  ?                          otherwise. ?                          Non-bleeding non-thrombosed external and internal  ?                          hemorrhoids were found during retroflexion, during  ?                          perianal exam and during digital exam. The  ?                          hemorrhoids were Grade II (internal hemorrhoids  ?                          that prolapse but reduce spontaneously). ?Complications:            No immediate complications. ?Estimated Blood Loss:     Estimated blood loss was minimal. ?Impression:               - Hemorrhoids found on digital rectal exam. ?                          - The examined portion of the ileum was normal. ?                          - Two 3  to 10 mm polyps in the rectum and in the  ?                          transverse colon, removed with a cold snare.  ?                          Resected and retrieved. ?                          - Normal mucosa in the entire examined colon  ?                          otherwise. ?                          - Non-bleeding non-thrombosed external and internal  ?                          hemorrhoids. ?Recommendation:           - The patient will be observed post-procedure,  ?  until all discharge criteria are met. ?                          - Discharge patient to home. ?                          - Patient has a contact number available for  ?                          emergencies. The signs and symptoms of potential  ?                          delayed complications were discussed with the  ?                          patient. Return to normal activities tomorrow.  ?                          Written discharge instructions were provided to the  ?                          patient. ?                          - High fiber diet. ?                          - Use FiberCon 1-2 tablets PO daily. ?                          - Continue present medications. ?                          - Await pathology results. ?                          - Repeat colonoscopy in 3 years for surveillance. ?                          - The findings and recommendations were discussed  ?                          with the patient. ?                          - The findings and recommendations were discussed  ?                          with the patient's family. ?Justice Britain, MD ?08/29/2021 8:44:50 AM ?

## 2021-08-29 NOTE — Progress Notes (Signed)
VS completed by AS.  Pt's states no medical or surgical changes since previsit or office visit.  

## 2021-08-29 NOTE — Op Note (Signed)
[facility] ?Patient Name: Kyle Sharp ?Procedure Date: 08/29/2021 7:22 AM ?MRN: [id number] ?Endoscopist: Justice Britain , MD ?Age: 41 ?Referring MD:  ?Date of Birth: [birth year] ?Gender: Male ?Account #: [id number] ?Procedure:                Upper GI endoscopy ?Indications:              Generalized abdominal pain, Abnormal CT of the GI  ?                          tract, Follow-up of small bowel obstruction ?Medicines:                Monitored Anesthesia Care ?Procedure:                Pre-Anesthesia Assessment: ?                          - Prior to the procedure, a History and Physical  ?                          was performed, and patient medications and  ?                          allergies were reviewed. The patient's tolerance of  ?                          previous anesthesia was also reviewed. The risks  ?                          and benefits of the procedure and the sedation  ?                          options and risks were discussed with the patient.  ?                          All questions were answered, and informed consent  ?                          was obtained. Prior Anticoagulants: The patient has  ?                          taken no previous anticoagulant or antiplatelet  ?                          agents. ASA Grade Assessment: III - A patient with  ?                          severe systemic disease. After reviewing the risks  ?                          and benefits, the patient was deemed in  ?                          satisfactory condition to undergo the procedure. ?  After obtaining informed consent, the endoscope was  ?                          passed under direct vision. Throughout the  ?                          procedure, the patient's blood pressure, pulse, and  ?                          oxygen saturations were monitored continuously. The  ?                          GIF HQ190 #[id number] was introduced through the  ?                          mouth, and  advanced to the second part of duodenum.  ?                          The upper GI endoscopy was accomplished without  ?                          difficulty. The patient tolerated the procedure. ?Scope In: ?Scope Out: ?Findings:                 White nummular lesions were noted in the entire  ?                          esophagus. Biopsies were taken with a cold forceps  ?                          for histology to rule out Candida. ?                          The Z-line was irregular and was found 39 cm from  ?                          the incisors. ?                          A 2 cm hiatal hernia was present. ?                          Patchy moderately erythematous mucosa without  ?                          bleeding was found in the entire examined stomach.  ?                          Biopsies were taken with a cold forceps for  ?                          histology and Helicobacter pylori testing. ?                          No  gross lesions were noted in the duodenal bulb,  ?                          in the first portion of the duodenum and in the  ?                          second portion of the duodenum. Biopsies were taken  ?                          with a cold forceps for histology. ?Complications:            No immediate complications. ?Estimated Blood Loss:     Estimated blood loss was minimal. ?Impression:               - White nummular lesions in esophageal mucosa.  ?                          Biopsied. ?                          - Z-line irregular, 39 cm from the incisors. ?                          - 2 cm hiatal hernia. ?                          - Erythematous mucosa in the stomach. Biopsied. ?                          - No gross lesions in the duodenal bulb, in the  ?                          first portion of the duodenum and in the second  ?                          portion of the duodenum. Biopsied. ?Recommendation:           - Proceed to scheduled colonoscopy. ?                          - Continue  present medications. ?                          - Await pathology results. ?                          - The findings and recommendations were discussed  ?                          with the patient. ?                          - The findings and recommendations were discussed  ?                          with the patient's family. ?Justice Britain, MD ?08/29/2021  8:37:22 AM ?

## 2021-08-29 NOTE — Progress Notes (Signed)
Report given to PACU, vss 

## 2021-08-31 ENCOUNTER — Telehealth: Payer: Self-pay

## 2021-08-31 NOTE — Telephone Encounter (Signed)
?  Follow up Call- ? ? ?  08/29/2021  ?  7:21 AM  ?Call back number  ?Post procedure Call Back phone  # [phone]  ?Permission to leave phone message Yes  ?  ? ?Patient questions: ? ?Do you have a fever, pain , or abdominal swelling? No. ?Pain Score  0 * ? ?Have you tolerated food without any problems? Yes.   ? ?Have you been able to return to your normal activities? Yes.   ? ?Do you have any questions about your discharge instructions: ?Diet   No. ?Medications  No. ?Follow up visit  No. ? ?Do you have questions or concerns about your Care? No. ? ?Actions: ?* If pain score is 4 or above: ?No action needed, pain <4. ? ? ? ? ? ?

## 2021-09-11 ENCOUNTER — Telehealth: Payer: Self-pay | Admitting: Gastroenterology

## 2021-09-11 NOTE — Telephone Encounter (Signed)
Patient's wife called for results from procedures last week.  Please call and advise.

## 2021-09-11 NOTE — Telephone Encounter (Signed)
I have advised the pt that the path has not been reviewed as of today.  I did tell him as soon as we get results we will contact him.  The pt has been advised of the information and verbalized understanding.

## 2021-09-14 ENCOUNTER — Other Ambulatory Visit: Payer: Self-pay

## 2021-09-14 ENCOUNTER — Encounter: Payer: Self-pay | Admitting: Gastroenterology

## 2021-09-14 ENCOUNTER — Telehealth: Payer: Self-pay | Admitting: Gastroenterology

## 2021-09-14 DIAGNOSIS — A048 Other specified bacterial intestinal infections: Secondary | ICD-10-CM

## 2021-09-14 MED ORDER — OMEPRAZOLE 20 MG PO CPDR: 20.0000 mg | DELAYED_RELEASE_CAPSULE | Freq: Two times a day (BID) | ORAL | 0 refills | Status: DC

## 2021-09-14 MED ORDER — TALICIA 250-12.5-10 MG PO CPDR: 4.0000 | DELAYED_RELEASE_CAPSULE | Freq: Three times a day (TID) | ORAL | 0 refills | Status: DC

## 2021-09-14 NOTE — Telephone Encounter (Signed)
Inbound call from patient stating that pharmacy does not have [place] nor Prilosec. Patient is requesting medication be resent to [facility] in [place] and is requesting a call to let him know when the medications have been sent. Please advise.   [address] [place], [place] [address]  Pharmacy number:  [phone]

## 2021-09-14 NOTE — Telephone Encounter (Signed)
The prescriptions have been sent to the [facility].  He has been advised to call if there are any further issues.

## 2021-09-27 ENCOUNTER — Other Ambulatory Visit: Payer: Self-pay | Admitting: Gastroenterology

## 2021-10-03 DIAGNOSIS — G40909 Epilepsy, unspecified, not intractable, without status epilepticus: Secondary | ICD-10-CM | POA: Diagnosis not present

## 2021-10-03 DIAGNOSIS — E7800 Pure hypercholesterolemia, unspecified: Secondary | ICD-10-CM | POA: Diagnosis not present

## 2021-10-03 DIAGNOSIS — R899 Unspecified abnormal finding in specimens from other organs, systems and tissues: Secondary | ICD-10-CM | POA: Diagnosis not present

## 2021-10-03 DIAGNOSIS — Z125 Encounter for screening for malignant neoplasm of prostate: Secondary | ICD-10-CM | POA: Diagnosis not present

## 2021-10-03 DIAGNOSIS — Z5181 Encounter for therapeutic drug level monitoring: Secondary | ICD-10-CM | POA: Diagnosis not present

## 2021-10-31 ENCOUNTER — Ambulatory Visit: Payer: BC Managed Care – PPO | Admitting: Gastroenterology

## 2021-11-05 ENCOUNTER — Ambulatory Visit (HOSPITAL_COMMUNITY): Admission: RE | Admit: 2021-11-05 | Payer: BC Managed Care – PPO | Source: Ambulatory Visit

## 2021-11-05 ENCOUNTER — Ambulatory Visit (HOSPITAL_COMMUNITY): Payer: BC Managed Care – PPO

## 2021-11-08 ENCOUNTER — Inpatient Hospital Stay: Payer: BC Managed Care – PPO | Admitting: Hematology and Oncology

## 2021-11-08 ENCOUNTER — Inpatient Hospital Stay: Payer: BC Managed Care – PPO

## 2021-11-15 ENCOUNTER — Ambulatory Visit (HOSPITAL_COMMUNITY)
Admission: RE | Admit: 2021-11-15 | Discharge: 2021-11-15 | Disposition: A | Discharge status: Home / Self Care | Payer: BC Managed Care – PPO | Source: Ambulatory Visit | Attending: Physician Assistant | Admitting: Physician Assistant

## 2021-11-15 DIAGNOSIS — K669 Disorder of peritoneum, unspecified: Secondary | ICD-10-CM | POA: Insufficient documentation

## 2021-11-15 DIAGNOSIS — E041 Nontoxic single thyroid nodule: Secondary | ICD-10-CM | POA: Insufficient documentation

## 2021-11-15 DIAGNOSIS — K668 Other specified disorders of peritoneum: Secondary | ICD-10-CM | POA: Diagnosis not present

## 2021-11-15 DIAGNOSIS — R935 Abnormal findings on diagnostic imaging of other abdominal regions, including retroperitoneum: Secondary | ICD-10-CM | POA: Diagnosis not present

## 2021-11-15 DIAGNOSIS — R188 Other ascites: Secondary | ICD-10-CM | POA: Diagnosis not present

## 2021-11-15 DIAGNOSIS — K7689 Other specified diseases of liver: Secondary | ICD-10-CM | POA: Diagnosis not present

## 2021-11-15 MED ORDER — SODIUM CHLORIDE (PF) 0.9 % IJ SOLN
INTRAMUSCULAR | Status: AC
  Filled 2021-11-15: qty 50

## 2021-11-15 MED ORDER — IOHEXOL 300 MG/ML  SOLN
100.0000 mL | Freq: Once | INTRAMUSCULAR | Status: AC | PRN
  Administered 2021-11-15: 100 mL via INTRAVENOUS

## 2021-11-15 MED ORDER — IOHEXOL 9 MG/ML PO SOLN
ORAL | Status: AC
  Filled 2021-11-15: qty 1000

## 2021-11-22 ENCOUNTER — Other Ambulatory Visit: Payer: Self-pay | Admitting: Hematology and Oncology

## 2021-11-22 ENCOUNTER — Other Ambulatory Visit: Payer: Self-pay

## 2021-11-22 ENCOUNTER — Inpatient Hospital Stay: Payer: BC Managed Care – PPO | Admitting: Hematology and Oncology

## 2021-11-22 ENCOUNTER — Inpatient Hospital Stay: Payer: BC Managed Care – PPO | Attending: Hematology and Oncology

## 2021-11-22 VITALS — BP 115/81 | HR 78 | Temp 98.7°F | Resp 18 | Ht 72.0 in | Wt 242.0 lb

## 2021-11-22 DIAGNOSIS — G40909 Epilepsy, unspecified, not intractable, without status epilepticus: Secondary | ICD-10-CM | POA: Insufficient documentation

## 2021-11-22 DIAGNOSIS — E041 Nontoxic single thyroid nodule: Secondary | ICD-10-CM | POA: Diagnosis not present

## 2021-11-22 DIAGNOSIS — K669 Disorder of peritoneum, unspecified: Secondary | ICD-10-CM | POA: Diagnosis not present

## 2021-11-22 DIAGNOSIS — Z79899 Other long term (current) drug therapy: Secondary | ICD-10-CM | POA: Insufficient documentation

## 2021-11-22 DIAGNOSIS — Z8249 Family history of ischemic heart disease and other diseases of the circulatory system: Secondary | ICD-10-CM | POA: Diagnosis not present

## 2021-11-22 DIAGNOSIS — Z8049 Family history of malignant neoplasm of other genital organs: Secondary | ICD-10-CM | POA: Insufficient documentation

## 2021-11-22 DIAGNOSIS — R21 Rash and other nonspecific skin eruption: Secondary | ICD-10-CM | POA: Insufficient documentation

## 2021-11-22 LAB — CMP (CANCER CENTER ONLY)
ALT: 35 U/L (ref 0–44)
AST: 36 U/L (ref 15–41)
Albumin: 4.5 g/dL (ref 3.5–5.0)
Alkaline Phosphatase: 92 U/L (ref 38–126)
Anion gap: 8 (ref 5–15)
BUN: 18 mg/dL (ref 6–20)
CO2: 25 mmol/L (ref 22–32)
Calcium: 9 mg/dL (ref 8.9–10.3)
Chloride: 104 mmol/L (ref 98–111)
Creatinine: 1.16 mg/dL (ref 0.61–1.24)
GFR, Estimated: >60 mL/min (ref >60)
Glucose, Bld: 89 mg/dL (ref 70–99)
Potassium: 4 mmol/L (ref 3.5–5.1)
Sodium: 137 mmol/L (ref 135–145)
Total Bilirubin: 0.3 mg/dL (ref 0.3–1.2)
Total Protein: 8.1 g/dL (ref 6.5–8.1)

## 2021-11-22 LAB — CBC WITH DIFFERENTIAL (CANCER CENTER ONLY)
Abs Immature Granulocytes: 0 10*3/uL (ref 0.00–0.07)
Basophils Absolute: 0 10*3/uL (ref 0.0–0.1)
Basophils Relative: 1 %
Eosinophils Absolute: 0.3 10*3/uL (ref 0.0–0.5)
Eosinophils Relative: 7 %
HCT: 38.9 % — ABNORMAL LOW (ref 39.0–52.0)
Hemoglobin: 13.5 g/dL (ref 13.0–17.0)
Immature Granulocytes: 0 %
Lymphocytes Relative: 29 %
Lymphs Abs: 1.2 10*3/uL (ref 0.7–4.0)
MCH: 28.1 pg (ref 26.0–34.0)
MCHC: 34.7 g/dL (ref 30.0–36.0)
MCV: 80.9 fL (ref 80.0–100.0)
Monocytes Absolute: 0.3 10*3/uL (ref 0.1–1.0)
Monocytes Relative: 7 %
Neutro Abs: 2.3 10*3/uL (ref 1.7–7.7)
Neutrophils Relative %: 56 %
Platelet Count: 191 10*3/uL (ref 150–400)
RBC: 4.81 MIL/uL (ref 4.22–5.81)
RDW: 13 % (ref 11.5–15.5)
WBC Count: 4 10*3/uL (ref 4.0–10.5)
nRBC: 0 % (ref 0.0–0.2)

## 2021-11-22 NOTE — Progress Notes (Signed)
[place] [phone]   [phone]  PROGRESS NOTE  Patient Care Team: Vernie Shanks, MD (Inactive) as PCP - General (Family Medicine)  Hematological/Oncological History # Thyroid Nodule, workup Underway # Abdominal Cysts/Nodules  Interval History:  Kyle Sharp 41 y.o. male with medical history significant for abdominal cyst/nodules who presents for a follow up visit. The patient's last visit was on 06/26/2021 at which time he established care. In the interim since the last visit he has undergone PET/CT scan which showed no evidence of malignancy as well as repeat CT scan which showed stability of the lesions.  He is also undergone GI evaluation with EGD which showed concern for H. pylori infection.  On exam today Kyle Sharp notes he has been well since her last visit.  He has completed his H. pylori treatment, though he did take his antibiotics concurrently with his antacid medication though there was post to be sequential.  He reports that he has not been having any abdominal pain since he was discharged from the hospital.  He has been having good bowel movements with no constipation or diarrhea.  He notes his appetite is been good and his weight is steady.  He is not having any symptoms of the ordinary.  He does have a small rash on his wrist which he notes occurs seasonally.  He has had prior biopsy which did not show any concerning abnormalities.  We also discussed the presence of a thyroid nodule though he is not having signs or symptoms concerning for hyperthyroidism.  He denies any fevers, chills, sweats, nausea, vomiting or diarrhea.  Full 10 point ROS is listed below.  The bulk of our discussion focused on steps moving forward given our extensive work-up.  The details of this conversation are noted below.  MEDICAL HISTORY:  Past Medical History:  Diagnosis Date   SBO (small bowel obstruction) ([place]) 06/19/2021   Seizure disorder ([place])     08-13-21- per pt 2014    SURGICAL HISTORY: Past Surgical History:  Procedure Laterality Date   EXPLORATORY LAPAROTOMY     At the age of 1 and at the age of 1    SOCIAL HISTORY: Social History   Socioeconomic History   Marital status: Married    Spouse name: Not on file   Number of children: Not on file   Years of education: Not on file   Highest education level: Not on file  Occupational History   Not on file  Tobacco Use   Smoking status: Never   Smokeless tobacco: Never  Substance and Sexual Activity   Alcohol use: Yes    Comment: Occasional   Drug use: Never   Sexual activity: Yes  Other Topics Concern   Not on file  Social History Narrative   Not on file   Social Determinants of Health   Financial Resource Strain: Not on file  Food Insecurity: Not on file  Transportation Needs: Not on file  Physical Activity: Not on file  Stress: Not on file  Social Connections: Not on file  Intimate Partner Violence: Not on file    FAMILY HISTORY: Family History  Problem Relation Age of Onset   Heart failure Mother    Uterine cancer Paternal Aunt    Heart attack Paternal Aunt    Colon cancer Neg Hx    Colon polyps Neg Hx    Esophageal cancer Neg Hx    Rectal cancer Neg Hx    Stomach cancer Neg Hx  ALLERGIES:  has No Known Allergies.  MEDICATIONS:  Current Outpatient Medications  Medication Sig Dispense Refill   Amoxicill-Rifabutin-Omeprazole (TALICIA) 614-43.1-54 MG CPDR Take 4 capsules by mouth in the morning, at noon, and at bedtime. 168 capsule 0   carbamazepine (TEGRETOL XR) 400 MG 12 hr tablet Take 400 mg by mouth 2 (two) times daily.     omeprazole (PRILOSEC) 20 MG capsule Take 1 capsule (20 mg total) by mouth 2 (two) times daily before a meal for 14 days. 28 capsule 0   ondansetron (ZOFRAN) 4 MG tablet Take 1 tablet (4 mg total) by mouth every 8 (eight) hours as needed for nausea or vomiting. (Patient not taking: Reported on 08/13/2021) 20 tablet 0    polyethylene glycol (MIRALAX / GLYCOLAX) 17 g packet Take 17 g by mouth daily as needed for mild constipation. (Patient not taking: Reported on 08/13/2021)     sennosides-docusate sodium (SENOKOT-S) 8.6-50 MG tablet Take 1 tablet by mouth daily as needed for constipation. (Patient not taking: Reported on 08/13/2021)     No current facility-administered medications for this visit.    REVIEW OF SYSTEMS:   Constitutional: ( - ) fevers, ( - )  chills , ( - ) night sweats Eyes: ( - ) blurriness of vision, ( - ) double vision, ( - ) watery eyes Ears, nose, mouth, throat, and face: ( - ) mucositis, ( - ) sore throat Respiratory: ( - ) cough, ( - ) dyspnea, ( - ) wheezes Cardiovascular: ( - ) palpitation, ( - ) chest discomfort, ( - ) lower extremity swelling Gastrointestinal:  ( - ) nausea, ( - ) heartburn, ( - ) change in bowel habits Skin: ( - ) abnormal skin rashes Lymphatics: ( - ) new lymphadenopathy, ( - ) easy bruising Neurological: ( - ) numbness, ( - ) tingling, ( - ) new weaknesses Behavioral/Psych: ( - ) mood change, ( - ) new changes  All other systems were reviewed with the patient and are negative.  PHYSICAL EXAMINATION:  Vitals:   11/22/21 0829  BP: 115/81  Pulse: 78  Resp: 18  Temp: 98.7 F (37.1 C)  SpO2: 98%   Filed Weights   11/22/21 0829  Weight: 242 lb (109.8 kg)    GENERAL: Well-appearing middle-aged African-American male, alert, no distress and comfortable SKIN: skin color, texture, turgor are normal, no rashes or significant lesions EYES: conjunctiva are pink and non-injected, sclera clear LUNGS: clear to auscultation and percussion with normal breathing effort HEART: regular rate & rhythm and no murmurs and no lower extremity edema Musculoskeletal: no cyanosis of digits and no clubbing  PSYCH: alert & oriented x 3, fluent speech NEURO: no focal motor/sensory deficits  LABORATORY DATA:  I have reviewed the data as listed    Latest Ref Rng & Units 11/22/2021     8:19 AM 06/26/2021    1:31 PM 06/21/2021    4:00 AM  CBC  WBC 4.0 - 10.5 K/uL 4.0  5.4  3.7   Hemoglobin 13.0 - 17.0 g/dL 13.5  13.8  12.8   Hematocrit 39.0 - 52.0 % 38.9  40.8  36.5   Platelets 150 - 400 K/uL 191  230  178        Latest Ref Rng & Units 11/22/2021    8:19 AM 06/26/2021    1:31 PM 06/21/2021    4:00 AM  CMP  Glucose 70 - 99 mg/dL 89  87  87   BUN 6 - 20  mg/dL '18  11  9   '$ Creatinine 0.61 - 1.24 mg/dL 1.16  1.08  1.05   Sodium 135 - 145 mmol/L 137  137  138   Potassium 3.5 - 5.1 mmol/L 4.0  4.1  3.9   Chloride 98 - 111 mmol/L 104  100  101   CO2 22 - 32 mmol/L '25  30  28   '$ Calcium 8.9 - 10.3 mg/dL 9.0  9.9  8.9   Total Protein 6.5 - 8.1 g/dL 8.1  8.7  7.2   Total Bilirubin 0.3 - 1.2 mg/dL 0.3  0.4  0.8   Alkaline Phos 38 - 126 U/L 92  113  86   AST 15 - 41 U/L 36  32  25   ALT 0 - 44 U/L 35  26  21     RADIOGRAPHIC STUDIES: I have personally reviewed the radiological images as listed and agreed with the findings in the report: Stable peritoneal nodules. CT Abdomen Pelvis W Contrast  Result Date: 11/16/2021 CLINICAL DATA:  Follow-up peritoneal collections and PET/CT 07/05/2021 EXAM: CT ABDOMEN AND PELVIS WITH CONTRAST TECHNIQUE: Multidetector CT imaging of the abdomen and pelvis was performed using the standard protocol following bolus administration of intravenous contrast. RADIATION DOSE REDUCTION: This exam was performed according to the departmental dose-optimization program which includes automated exposure control, adjustment of the mA and/or kV according to patient size and/or use of iterative reconstruction technique. CONTRAST:  146m OMNIPAQUE IOHEXOL 300 MG/ML  SOLN COMPARISON:  CT abdomen and pelvis 06/19/2021 FINDINGS: Lower chest: No acute abnormality. Hepatobiliary: No focal liver abnormality is seen. No gallstones, gallbladder wall thickening, or biliary dilatation. Pancreas: Unremarkable. No pancreatic ductal dilatation or surrounding inflammatory changes.  Spleen: Normal in size without focal abnormality. Adrenals/Urinary Tract: Adrenal glands are unremarkable. Kidneys are normal, without renal calculi, focal lesion, or hydronephrosis. Bladder is unremarkable. Stomach/Bowel: Stomach is within normal limits. The appendix is not definitively visualized. No evidence of bowel wall thickening, distention, or inflammatory changes. Vascular/Lymphatic: No significant vascular findings are present. No enlarged abdominal or pelvic lymph nodes. Reproductive: Unremarkable prostate. Other: Multiple well-defined homogeneous collections of loculated fluid or low-density soft tissue are identified throughout the peritoneal cavity and are not significantly changed from 06/19/2021 using similar measuring technique. For example, 4.8 by 3.0 cm lesion in the lesser sac on image 22/series 2. The 1.9 x 4.4 cm collection anterior to the liver on image 13 of series 2. The 3.8 x 2.0 cm collection anterior to the inferior vena cava on image 45 of series 2. Several omental collections are redemonstrated. Additional low-density fluid/soft tissue is identified in the central mesentery and left pericolic gutter unchanged. Musculoskeletal: No acute or significant osseous findings. IMPRESSION: No significant change in multiple low-density fluid collections or soft tissue throughout the peritoneal cavity. Electronically Signed   By: TPlacido SouM.D.   On: 11/16/2021 10:07   [place]  Result Date: 11/15/2021 CLINICAL DATA:  1.7 cm left thyroid nodule identified on PET scan EXAM: THYROID ULTRASOUND TECHNIQUE: Ultrasound examination of the thyroid gland and adjacent soft tissues was performed. COMPARISON:  PET-CT 09/04/2021 FINDINGS: Parenchymal Echotexture: Normal Isthmus: 0.4 cm Right lobe: 4.9 x 1.6 x 2.0 cm Left lobe: 4.1 x 1.9 x 2.1 cm _________________________________________________________ Estimated total number of nodules >/= 1 cm: 1 Number of spongiform nodules >/=  2 cm not described  below (TR1): 0 Number of mixed cystic and solid nodules >/= 1.5 cm not described below ([place]: 0 _________________________________________________________ Nodule #  1: Location: Left; mid Maximum size: 1.7 cm; Other 2 dimensions: 1.5 x 1.4 cm Composition: solid/almost completely solid (2) Echogenicity: isoechoic (1) Shape: taller-than-wide (3) Margins: smooth (0) Echogenic foci: none (0) ACR TI-RADS total points: 6. ACR TI-RADS risk category: TR4 (4-6 points). ACR TI-RADS recommendations: **Given size (>/= 1.5 cm) and appearance, fine needle aspiration of this moderately suspicious nodule should be considered based on TI-RADS criteria. IMPRESSION: Nodule 1 (TI-RADS 4), measuring 1.7 cm, located in the mid left thyroid lobe, meets criteria for FNA. The above is in keeping with the ACR TI-RADS recommendations - J Am Coll Radiol 2017;14:587-595. Electronically Signed   By: Miachel Roux M.D.   On: 11/15/2021 08:39    ASSESSMENT & PLAN Kyle Sharp 41 y.o. male with medical history significant for abdominal cyst/nodules who presents for a follow up visit.   # Thyroid Nodule, workup Underway -- Noted initially on PET CT scan from 07/05/2021. --Biopsy recommended after ultrasound was performed on 11/15/2021. --Thyroid biopsy ordered today.  # Abdominal Cysts/Nodules, likely benign --PET CT scan did not show any FDG avidity in these nodules. --Lesions are not amenable to biopsy via IR and surgery declined laparoscopic biopsy. --Clinical stability noted from CT scans performed on 06/19/2021 and 11/15/2021. --At this time low suspicion for malignancy.  Most likely represent benign findings. --In the event patient would have a repeat event of SBO and surgical intervention was required would recommend biopsy of 1 of these lesions at that time. --Return patient to the care of primary care provider.  Orders Placed This Encounter  Procedures   [place] FNA BX THYROID 1ST LESION AFIRMA    Standing Status:   Future     Standing Expiration Date:   11/23/2022    Order Specific Question:   Reason for Exam (SYMPTOM  OR DIAGNOSIS REQUIRED)    Answer:   PET CT showed concern for thyroid nodule, recommend biopsy    Order Specific Question:   Preferred location?    Answer:   [facility], [place]    All questions were answered. The patient knows to call the clinic with any problems, questions or concerns.  A total of more than 30 minutes were spent on this encounter with face-to-face time and non-face-to-face time, including preparing to see the patient, ordering tests and/or medications, counseling the patient and coordination of care as outlined above.   Ledell Peoples, MD Department of Hematology/Oncology [place] Phone: [phone] Pager: [phone] Email: [email or web address]  11/22/2021 9:14 AM

## 2021-12-18 ENCOUNTER — Other Ambulatory Visit: Payer: BC Managed Care – PPO

## 2021-12-18 ENCOUNTER — Ambulatory Visit: Payer: BC Managed Care – PPO | Admitting: Gastroenterology

## 2021-12-18 ENCOUNTER — Encounter: Payer: Self-pay | Admitting: Gastroenterology

## 2021-12-18 VITALS — BP 128/80 | HR 74 | Ht 74.0 in | Wt 247.6 lb

## 2021-12-18 DIAGNOSIS — A048 Other specified bacterial intestinal infections: Secondary | ICD-10-CM

## 2021-12-18 DIAGNOSIS — K56609 Unspecified intestinal obstruction, unspecified as to partial versus complete obstruction: Secondary | ICD-10-CM

## 2021-12-18 DIAGNOSIS — Z86010 Personal history of colonic polyps: Secondary | ICD-10-CM | POA: Diagnosis not present

## 2021-12-18 DIAGNOSIS — Z8719 Personal history of other diseases of the digestive system: Secondary | ICD-10-CM

## 2021-12-18 DIAGNOSIS — R188 Other ascites: Secondary | ICD-10-CM | POA: Diagnosis not present

## 2021-12-18 NOTE — Patient Instructions (Signed)
Your provider has requested that you go to the basement level for lab work before leaving today. Press "B" on the elevator. The lab is located at the first door on the left as you exit the elevator.  Due to recent changes in healthcare laws, you may see the results of your imaging and laboratory studies on MyChart before your provider has had a chance to review them.  We understand that in some cases there may be results that are confusing or concerning to you. Not all laboratory results come back in the same time frame and the provider may be waiting for multiple results in order to interpret others.  Please give [place] 48 hours in order for your provider to thoroughly review all the results before contacting the office for clarification of your results.   _______________________________________________________  If you are age 13 or older, your body mass index should be between 23-30. Your Body mass index is 31.79 kg/m. If this is out of the aforementioned range listed, please consider follow up with your Primary Care Provider.  If you are age 42 or younger, your body mass index should be between 19-25. Your Body mass index is 31.79 kg/m. If this is out of the aformentioned range listed, please consider follow up with your Primary Care Provider.   ________________________________________________________  The [facility] providers would like to encourage you to use [facility] to communicate with providers for non-urgent requests or questions.  Due to long hold times on the telephone, sending your provider a message by [facility] may be a faster and more efficient way to get a response.  Please allow 48 business hours for a response.  Please remember that this is for non-urgent requests.  _______________________________________________________  I appreciate the opportunity to care for you. Justice Britain, MD

## 2021-12-21 ENCOUNTER — Other Ambulatory Visit: Payer: BC Managed Care – PPO

## 2021-12-21 DIAGNOSIS — A048 Other specified bacterial intestinal infections: Secondary | ICD-10-CM

## 2021-12-21 DIAGNOSIS — B9681 Helicobacter pylori [H. pylori] as the cause of diseases classified elsewhere: Secondary | ICD-10-CM | POA: Diagnosis not present

## 2021-12-22 ENCOUNTER — Encounter: Payer: Self-pay | Admitting: Gastroenterology

## 2021-12-22 DIAGNOSIS — A048 Other specified bacterial intestinal infections: Secondary | ICD-10-CM | POA: Insufficient documentation

## 2021-12-22 DIAGNOSIS — R188 Other ascites: Secondary | ICD-10-CM | POA: Insufficient documentation

## 2021-12-22 DIAGNOSIS — Z86010 Personal history of colonic polyps: Secondary | ICD-10-CM | POA: Insufficient documentation

## 2021-12-22 DIAGNOSIS — Z860101 Personal history of adenomatous and serrated colon polyps: Secondary | ICD-10-CM | POA: Insufficient documentation

## 2021-12-22 NOTE — Progress Notes (Signed)
[place] VISIT   Primary Care Provider Vernie Shanks, MD (Inactive) No address on file None   Patient Profile: Kyle Sharp is a 41 y.o. 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 pylori entheses pending eradication), colon polyps (TA's).  The patient presents to the [facility] for an evaluation and management of problem(s) noted below:  Problem List 1. H. pylori infection   2. Intra-abdominal fluid/tissue collections CHRONIC   3. History of small bowel obstruction   4. Hx of adenomatous colonic polyps     History of Present Illness This is the patient's first visit to the outpatient [place] clinic itself though I met him for an upper and lower endoscopy after being referred by our hematology/oncology team.  In brief, this individual had been admitted earlier in the year with a small bowel obstruction.  He has had multiple SBO's since a very early age that have required surgical intervention.  In February he had evidence of loculated fluid collections or low-density soft tissue that were found and potential etiologies for his bowel obstruction versus adhesions.  When these were reviewed, the patient has had these findings for over 10 years.  There was question as to whether EUS sampling may be required but due to the concern that there was actual fluid and whether there could be duplication cysts at play, nothing was further pursued.  We did an upper and lower endoscopy and found that he had H. pylori gastritis as well as some adenomatous colon tissue.  Today, the patient returns for follow-up.  He completed his antibiotics though did not take a sequential course but rather took all the medications at once.  He is worried as to whether he may still have the infection even though he is feeling well and really has not noticed  any change in his symptoms since his procedures.  His bowel habits are normal.  He is not having any new progressive abdominal pain or discomfort.  He has not had any symptoms that are reminiscent of his small bowel obstruction up to this point.  GI Review of Systems Positive as above Negative for pyrosis, dysphagia, odynophagia, nausea, vomiting, change in bowel habits, melena, hematochezia  Review of Systems General: Denies fevers/chills/weight loss unintentionally Cardiovascular: Denies chest pain Pulmonary: Denies shortness of breath Gastroenterological: See HPI Genitourinary: Denies darkened urine Hematological: Denies easy bruising/bleeding Dermatological: Denies jaundice Psychological: Mood is stable   Medications Current Outpatient Medications  Medication Sig Dispense Refill   carbamazepine (TEGRETOL XR) 400 MG 12 hr tablet Take 400 mg by mouth 2 (two) times daily.     ondansetron (ZOFRAN) 4 MG tablet Take 1 tablet (4 mg total) by mouth every 8 (eight) hours as needed for nausea or vomiting. 20 tablet 0   polyethylene glycol (MIRALAX / GLYCOLAX) 17 g packet Take 17 g by mouth daily as needed for mild constipation.     sennosides-docusate sodium (SENOKOT-S) 8.6-50 MG tablet Take 1 tablet by mouth daily as needed for constipation.     Amoxicill-Rifabutin-Omeprazole (TALICIA) 235-36.1-44 MG CPDR Take 4 capsules by mouth in the morning, at noon, and at bedtime. (Patient not taking: Reported on 12/18/2021) 168 capsule 0   omeprazole (PRILOSEC) 20 MG capsule Take 1 capsule (20 mg total) by mouth 2 (two) times daily before a meal for 14 days. 28 capsule 0   No current facility-administered medications for this visit.  Allergies No Known Allergies  Histories Past Medical History:  Diagnosis Date   SBO (small bowel obstruction) ([place]) 06/19/2021   Seizure disorder ([place])    08-13-21- per pt 2014   Past Surgical History:  Procedure Laterality Date   EXPLORATORY LAPAROTOMY      At the age of 1 and at the age of 25   Social History   Socioeconomic History   Marital status: Married    Spouse name: Not on file   Number of children: Not on file   Years of education: Not on file   Highest education level: Not on file  Occupational History   Not on file  Tobacco Use   Smoking status: Never   Smokeless tobacco: Never  Substance and Sexual Activity   Alcohol use: Yes    Comment: Occasional   Drug use: Never   Sexual activity: Yes  Other Topics Concern   Not on file  Social History Narrative   Not on file   Social Determinants of Health   Financial Resource Strain: Not on file  Food Insecurity: Not on file  Transportation Needs: Not on file  Physical Activity: Not on file  Stress: Not on file  Social Connections: Not on file  Intimate Partner Violence: Not on file   Family History  Problem Relation Age of Onset   Heart failure Mother    Uterine cancer Paternal Aunt    Heart attack Paternal Aunt    Colon cancer Neg Hx    Colon polyps Neg Hx    Esophageal cancer Neg Hx    Rectal cancer Neg Hx    Stomach cancer Neg Hx    Inflammatory bowel disease Neg Hx    Liver disease Neg Hx    Pancreatic cancer Neg Hx    I have reviewed his medical, social, and family history in detail and updated the electronic medical record as necessary.    PHYSICAL EXAMINATION  BP 128/80   Pulse 74   Ht _0  (1.88 m)   Wt 247 lb 9.6 oz (112.3 kg)   SpO2 98%   BMI 31.79 kg/m  Wt Readings from Last 3 Encounters:  12/18/21 247 lb 9.6 oz (112.3 kg)  11/22/21 242 lb (109.8 kg)  08/29/21 232 lb (105.2 kg)  GEN: NAD, appears stated age, doesn't appear chronically ill PSYCH: Cooperative, without pressured speech EYE: Conjunctivae pink, sclerae anicteric ENT: MMM CV: Nontachycardic RESP: No audible wheezing GI: NABS, soft, surgical scars present, rounded, nontender, without rebound MSK/EXT: No lower extremity edema SKIN: No jaundice NEURO:  Alert & Oriented x 3,  no focal deficits   REVIEW OF DATA  I reviewed the following data at the time of this encounter:  GI Procedures and Studies  May 2003 EGD - White nummular lesions in esophageal mucosa. Biopsied. - Z-line irregular, 39 cm from the incisors. - 2 cm hiatal hernia. - Erythematous mucosa in the stomach. Biopsied. - No gross lesions in the duodenal bulb, in the first portion of the duodenum and in the second portion of the duodenum. Biopsied.  May 2023 colonoscopy - Hemorrhoids found on digital rectal exam. - The examined portion of the ileum was normal. - Two 3 to 10 mm polyps in the rectum and in the transverse colon, removed with a cold snare. Resected and retrieved. - Normal mucosa in the entire examined colon otherwise. - Non-bleeding non-thrombosed external and internal hemorrhoids.  Pathology Diagnosis 1. Surgical [P], duodenum - DUODENAL MUCOSA WITH NO SPECIFIC HISTOPATHOLOGIC  CHANGES - NEGATIVE FOR INCREASED INTRAEPITHELIAL LYMPHOCYTES OR VILLOUS ARCHITECTURAL CHANGES 2. Surgical [P], random gastric - GASTRIC ANTRAL AND OXYNTIC MUCOSA WITH HELICOBACTER PYLORI-ASSOCIATED GASTRITIS - HELICOBACTER PYLORI-LIKE ORGANISMS WERE IDENTIFIED ON ROUTINE H&E STAIN 3. Surgical [P], esophagus biopsies - ESOPHAGEAL SQUAMOUS MUCOSA WITH NO SPECIFIC HISTOPATHOLOGIC CHANGES - NEGATIVE FOR INCREASED INTRAEPITHELIAL EOSINOPHILS 4. Surgical [P], colon, transverse, rectal, polyp (2) - TUBULAR ADENOMA WITHOUT HIGH-GRADE DYSPLASIA OR MALIGNANCY - HYPERPLASTIC POLYP  Laboratory Studies  Reviewed those in epic  Imaging Studies  July 2023 CT abdomen pelvis with contrast FINDINGS: Lower chest: No acute abnormality. Hepatobiliary: No focal liver abnormality is seen. No gallstones, gallbladder wall thickening, or biliary dilatation. Pancreas: Unremarkable. No pancreatic ductal dilatation or surrounding inflammatory changes. Spleen: Normal in size without focal abnormality. Adrenals/Urinary  Tract: Adrenal glands are unremarkable. Kidneys are normal, without renal calculi, focal lesion, or hydronephrosis. Bladder is unremarkable. Stomach/Bowel: Stomach is within normal limits. The appendix is not definitively visualized. No evidence of bowel wall thickening, distention, or inflammatory changes. Vascular/Lymphatic: No significant vascular findings are present. No enlarged abdominal or pelvic lymph nodes. Reproductive: Unremarkable prostate. Other: Multiple well-defined homogeneous collections of loculated fluid or low-density soft tissue are identified throughout the peritoneal cavity and are not significantly changed from 06/19/2021 using similar measuring technique. For example, 4.8 by 3.0 cm lesion in the lesser sac on image 22/series 2. The 1.9 x 4.4 cm collection anterior to the liver on image 13 of series 2. The 3.8 x 2.0 cm collection anterior to the inferior vena cava on image 45 of series 2. Several omental collections are redemonstrated. Additional low-density fluid/soft tissue is identified in the central mesentery and left pericolic gutter unchanged. Musculoskeletal: No acute or significant osseous findings.   IMPRESSION: No significant change in multiple low-density fluid collections or soft tissue throughout the peritoneal cavity.   ASSESSMENT  Mr. Clayburn is a 41 y.o. 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 pylori entheses pending eradication), colon polyps (TA's).  The patient is seen today for evaluation and management of:  1. H. pylori infection   2. Intra-abdominal fluid/tissue collections CHRONIC   3. History of small bowel obstruction   4. Hx of adenomatous colonic polyps    The patient is hemodynamically and clinically stable at this time.  The etiology of his intra-abdominal fluid/tissue collections that have been chronic in  nature is not completely defined.  But he has had these for almost 10 years or longer.  The patient is not having any symptoms of SBO at this time.  Previous discussion at [facility] was whether we would monitor this and as he is not having symptoms I think that is reasonable at this point for [place] to continue monitoring.  In 1 to 2 years if he is doing well he may be reasonable to repeat cross-sectional imaging to see if anything else develops or changes.  In regards to his recent H. pylori infection diagnosis he was treated though may not have taken the medications in the true sequential order.  He is going to be given a stool kit in order to evaluate whether H. pylori has been eradicated.   PLAN  Stool H. pylori to be obtained Consider repeat imaging with a CT abdomen/pelvis with IV and oral contrast in July 2024 If fluid/tissue collections are enlarging significantly will need to consider role of endoscopic ultrasound We will be due colon cancer screening/colon polyp surveillance in  2026   Orders Placed This Encounter  Procedures   H. pylori antigen, stool    New Prescriptions   No medications on file   Modified Medications   No medications on file    Planned Follow Up Return if symptoms worsen or fail to improve.   Total Time in Face-to-Face and in Coordination of Care for patient including independent/personal interpretation/review of prior testing, medical history, examination, medication adjustment, communicating results with the patient directly, and documentation within the EHR is 25 minutes.   Justice Britain, MD [facility] Office # [phone]

## 2021-12-24 LAB — H. PYLORI ANTIGEN, STOOL: H pylori Ag, Stl: NEGATIVE

## 2021-12-27 ENCOUNTER — Ambulatory Visit
Admission: RE | Admit: 2021-12-27 | Discharge: 2021-12-27 | Disposition: A | Discharge status: Home / Self Care | Payer: BC Managed Care – PPO | Source: Ambulatory Visit | Attending: Hematology and Oncology | Admitting: Hematology and Oncology

## 2021-12-27 ENCOUNTER — Other Ambulatory Visit (HOSPITAL_COMMUNITY)
Admission: RE | Admit: 2021-12-27 | Discharge: 2021-12-27 | Disposition: A | Discharge status: Home / Self Care | Payer: BC Managed Care – PPO | Source: Ambulatory Visit | Attending: Radiology | Admitting: Radiology

## 2021-12-27 DIAGNOSIS — E041 Nontoxic single thyroid nodule: Secondary | ICD-10-CM | POA: Insufficient documentation

## 2021-12-31 ENCOUNTER — Telehealth: Payer: Self-pay | Admitting: *Deleted

## 2021-12-31 LAB — CYTOLOGY - NON PAP

## 2021-12-31 NOTE — Telephone Encounter (Signed)
-----   Message from Orson Slick, MD sent at 12/31/2021  1:47 PM EDT ----- Regarding: P1 Please let Kyle Sharp know that his thyroid biopsy was consistent with benign nodules. There is no need for further evaluation.   ----- Message ----- From: Interface, Lab In Three Zero One Sent: 12/31/2021   1:39 PM EDT To: Orson Slick, MD

## 2021-12-31 NOTE — Telephone Encounter (Signed)
TCT patient regarding recent thyroid biopsy results.  Spoke to pt and advised that the biopsy was consistent with benign thyroid nodules. Advise dthat he does not need to f/u in this clinic at this time. Advised to continue to f/u with his PCP as needed.  Pt voiced understanding.

## 2022-01-11 DIAGNOSIS — R899 Unspecified abnormal finding in specimens from other organs, systems and tissues: Secondary | ICD-10-CM | POA: Diagnosis not present

## 2022-03-27 DIAGNOSIS — L309 Dermatitis, unspecified: Secondary | ICD-10-CM | POA: Diagnosis not present

## 2022-03-27 DIAGNOSIS — D485 Neoplasm of uncertain behavior of skin: Secondary | ICD-10-CM | POA: Diagnosis not present

## 2022-03-27 DIAGNOSIS — B353 Tinea pedis: Secondary | ICD-10-CM | POA: Diagnosis not present

## 2022-04-10 DIAGNOSIS — L910 Hypertrophic scar: Secondary | ICD-10-CM | POA: Diagnosis not present

## 2022-04-10 DIAGNOSIS — L649 Androgenic alopecia, unspecified: Secondary | ICD-10-CM | POA: Diagnosis not present

## 2022-07-09 DIAGNOSIS — E7800 Pure hypercholesterolemia, unspecified: Secondary | ICD-10-CM | POA: Diagnosis not present

## 2022-07-09 DIAGNOSIS — Z125 Encounter for screening for malignant neoplasm of prostate: Secondary | ICD-10-CM | POA: Diagnosis not present

## 2022-07-09 DIAGNOSIS — Z79899 Other long term (current) drug therapy: Secondary | ICD-10-CM | POA: Diagnosis not present

## 2022-07-09 DIAGNOSIS — G40909 Epilepsy, unspecified, not intractable, without status epilepticus: Secondary | ICD-10-CM | POA: Diagnosis not present

## 2022-12-31 DIAGNOSIS — Z125 Encounter for screening for malignant neoplasm of prostate: Secondary | ICD-10-CM | POA: Diagnosis not present

## 2022-12-31 DIAGNOSIS — E7800 Pure hypercholesterolemia, unspecified: Secondary | ICD-10-CM | POA: Diagnosis not present

## 2022-12-31 DIAGNOSIS — G40909 Epilepsy, unspecified, not intractable, without status epilepticus: Secondary | ICD-10-CM | POA: Diagnosis not present

## 2023-01-30 DIAGNOSIS — R21 Rash and other nonspecific skin eruption: Secondary | ICD-10-CM | POA: Diagnosis not present

## 2023-01-30 DIAGNOSIS — Z0000 Encounter for general adult medical examination without abnormal findings: Secondary | ICD-10-CM | POA: Diagnosis not present

## 2023-01-30 DIAGNOSIS — G40909 Epilepsy, unspecified, not intractable, without status epilepticus: Secondary | ICD-10-CM | POA: Diagnosis not present

## 2023-01-30 DIAGNOSIS — E7800 Pure hypercholesterolemia, unspecified: Secondary | ICD-10-CM | POA: Diagnosis not present

## 2023-01-30 DIAGNOSIS — Z125 Encounter for screening for malignant neoplasm of prostate: Secondary | ICD-10-CM | POA: Diagnosis not present

## 2023-02-05 DIAGNOSIS — L531 Erythema annulare centrifugum: Secondary | ICD-10-CM | POA: Diagnosis not present

## 2023-03-20 IMAGING — CT CT ABD-PELV W/ CM
2 of 5 series · 11 of 46 positions shown, 12 images · IV contrast (APPLIED)
Comparison: None.
COMPARISON: None.

Addendum:
CLINICAL DATA: Nausea vomiting with abdominal pain.

EXAM:
CT ABDOMEN AND PELVIS WITH CONTRAST
TECHNIQUE: Multidetector CT imaging of the abdomen and pelvis was performed
using the standard protocol following bolus administration of
intravenous contrast.

[Series 3: abd/ pelvis 5.0 i30f 2 · axial · 0.98mm/px · z∈[+347,+747]mm · 8 of 102 slices shown, 9 images]
[im 11/102  soft-tissue]
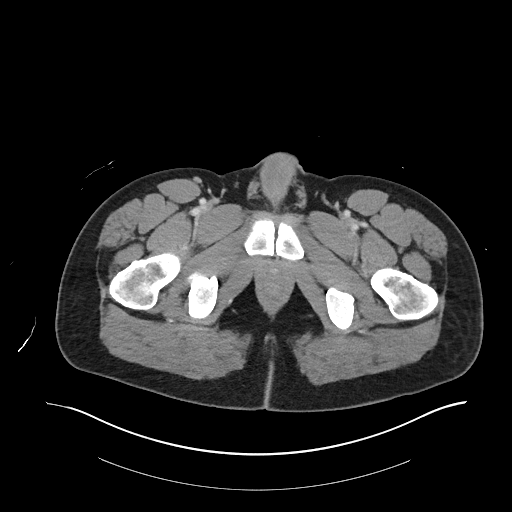
[im 11/102  bone]
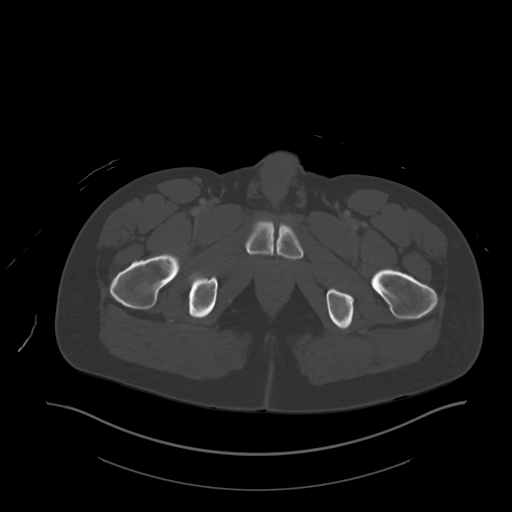
[im 21/102  soft-tissue]
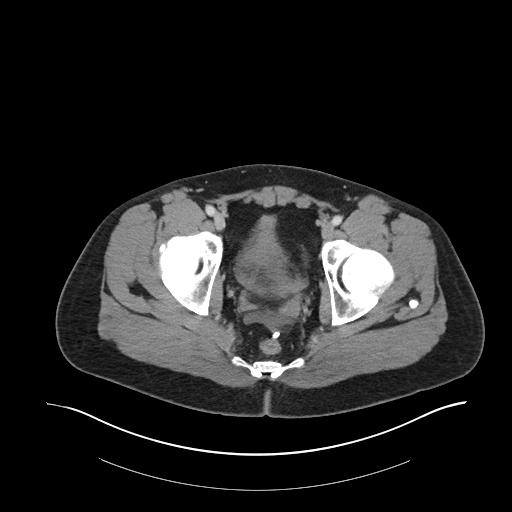
[im 31/102  soft-tissue]
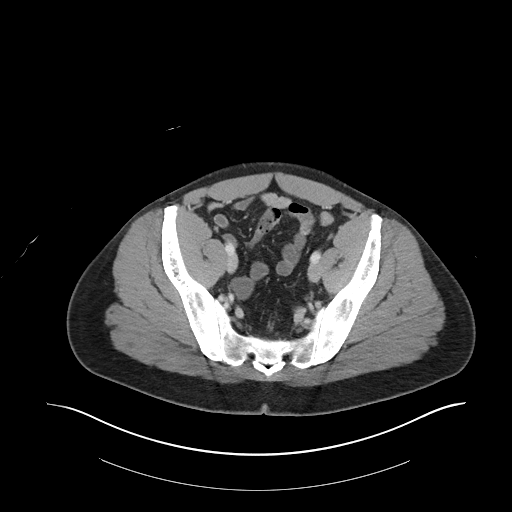
[im 46/102  soft-tissue]
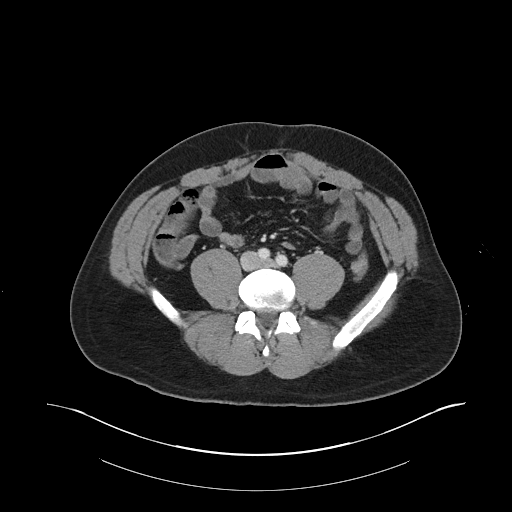
[im 56/102  soft-tissue]
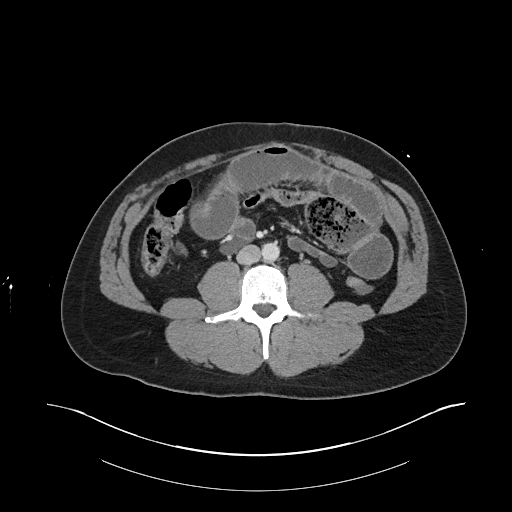
[im 71/102  soft-tissue]
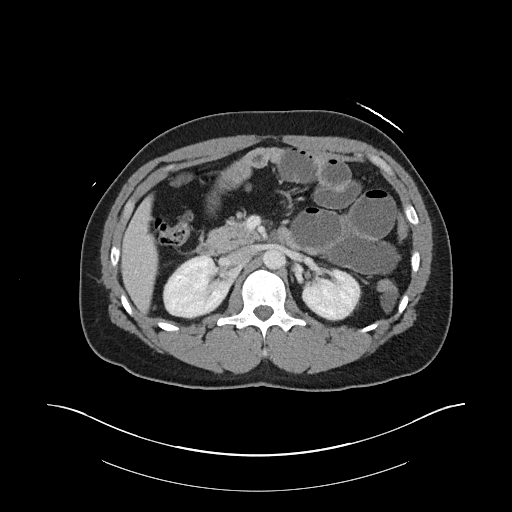
[im 81/102  soft-tissue]
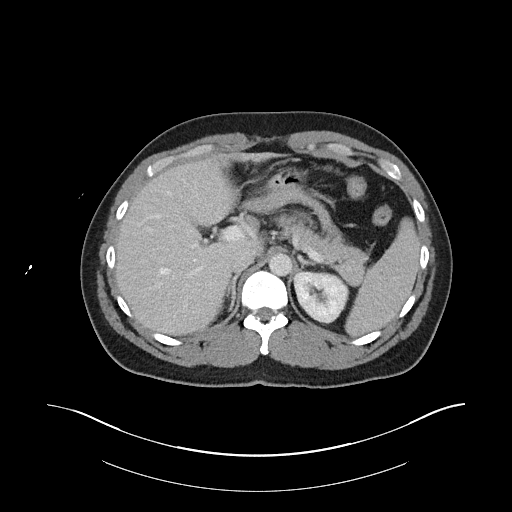
[im 91/102  soft-tissue]
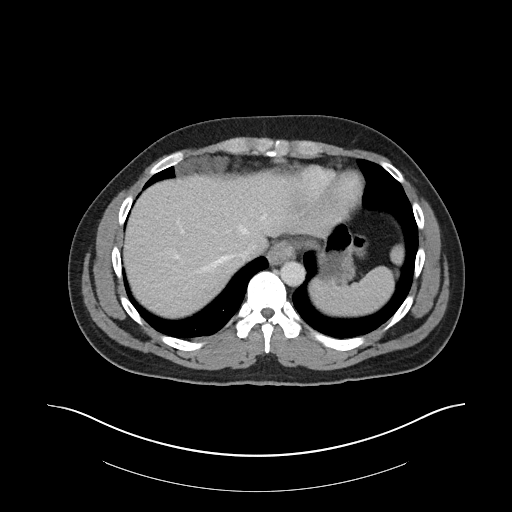

[Series 6: coronal soft tissue · coronal · 0.92mm/px · 3 of 98 slices shown]
[im 33/98  soft-tissue]
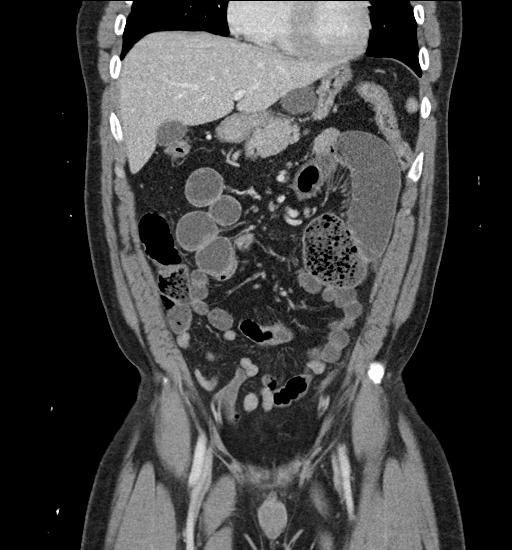
[im 44/98  soft-tissue]
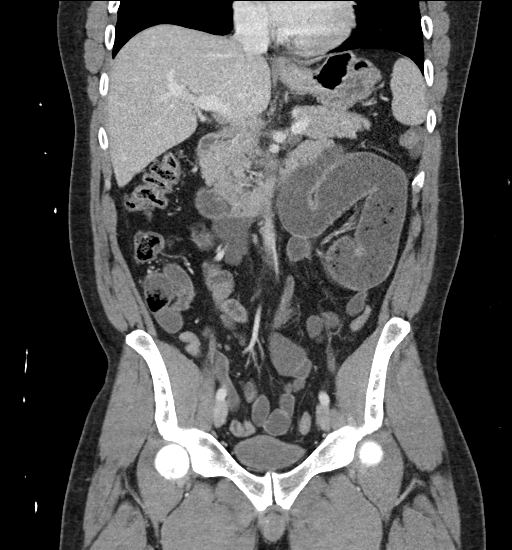
[im 54/98  soft-tissue]
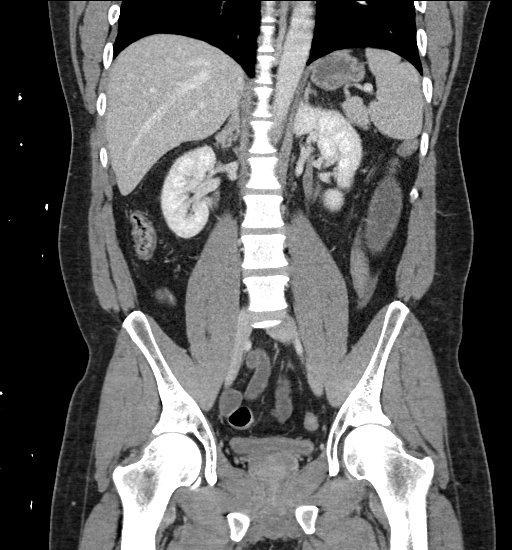

[11 of 46 positions shown; findings below may reference images not displayed]

RADIATION DOSE REDUCTION: This exam was performed according to the
departmental dose-optimization program which includes automated
exposure control, adjustment of the mA and/or kV according to
patient size and/or use of iterative reconstruction technique.

CONTRAST:  100mL OMNIPAQUE IOHEXOL 300 MG/ML  SOLN
FINDINGS: Lower chest: Unremarkable.

Hepatobiliary: No suspicious focal abnormality within the liver
parenchyma. There is no evidence for gallstones, gallbladder wall
thickening, or pericholecystic fluid. No intrahepatic or
extrahepatic biliary dilation.

Pancreas: No focal mass lesion. No dilatation of the main duct. No
intraparenchymal cyst. No peripancreatic edema.

Spleen: No splenomegaly. No focal mass lesion.

Adrenals/Urinary Tract: No adrenal nodule or mass. Kidneys
unremarkable. No evidence for hydroureter. The urinary bladder
appears normal for the degree of distention.

Stomach/Bowel: Stomach is unremarkable. No gastric wall thickening.
No evidence of outlet obstruction. Duodenum is normally positioned
as is the ligament of Treitz. Small bowel in the left upper quadrant
and central abdomen is dilated and fluid-filled measuring up to
cm diameter. Fecalization of enteric contents noted in the left
abdominal loop (axial image 49/3) with rapid tapering into a
nondilated loop (axial 47/3) this appears to be in the region of the
distal jejunum. Ileal loops are decompressed. Terminal ileum
unremarkable. The appendix is normal. Colon is diffusely
decompressed.

Vascular/Lymphatic: No abdominal aortic aneurysm. No abdominal
aortic atherosclerotic calcification. There is no gastrohepatic or
hepatoduodenal ligament lymphadenopathy. No retroperitoneal or
mesenteric lymphadenopathy. No pelvic sidewall lymphadenopathy.

Reproductive: The prostate gland and seminal vesicles are
unremarkable.

Other: Multiple well-defined homogeneous collections of loculated
fluid or low-density soft tissue are identified throughout the
peritoneal cavity. 4.2 x 3.1 cm lesion identified in the lesser sac
on image 19/series 3. 4.7 x 1.7 cm collection identified anterior to
the liver on image 11/series 3. 3.7 x 1.8 cm collection identified
anterior to the inferior vena cava on 43/3. Several omental
collections are evident. Material is identified in the left
paracolic gutter (images 33-56 of series 3. Small volume free fluid
noted in the posterior pelvis.

Musculoskeletal: No worrisome lytic or sclerotic osseous
abnormality.
IMPRESSION: 1. Multiple well-defined homogeneous collections of loculated fluid
or low-density soft tissue throughout the peritoneal cavity and in
the anterior right juxta. Imaging features are nonspecific but raise
the question of peritoneal carcinomatosis. PET-CT may prove helpful
to further evaluate. Ultimately, tissue sampling likely warranted.
No appendix is visible on the study in the patient may be status
post appendectomy. If so, correlation with pathology report from
appendectomy suggested.
2. Small bowel obstruction with transition point likely in the
region of the distal jejunum.
3. Small volume free fluid in the posterior pelvis.

ADDENDUM:
Prior exam from 05/25/2012 performed at [facility] has
been loaded to PACS. This exam shows multiple well-defined
homogeneous collections of loculated fluid or low-density soft
tissue similar to the current exam. The lesion identified in the
region of the lesser sac measures 3.8 x 3.7 cm on the study from 9
years ago compared to 4.2 x 3.1 cm today. Small collections seen in
the anterior juxta diaphragmatic spaces bilaterally are similar
although the dominant 4.7 x 1.7 cm lesion measured on the current
study was not included in the field of view on the prior exam. Small
collection in the left paracolic gutter was present previously but
was smaller at that time and extends further down into the pelvis on
the current study. The 3.7 x 1.8 cm collection anterior to the
inferior vena cava on the current exam was not present on the
previous study. Given that most of the collections were present on
the previous study, imaging features suggest a benign etiology
although, again, the lesion anterior to the inferior vena cava is
new in the interval and the collection in the left paracolic gutter
is slightly more extensive than it was on the previous study. As
such, follow-up likely warranted.

*** End of Addendum ***
RADIATION DOSE REDUCTION: This exam was performed according to the
departmental dose-optimization program which includes automated
exposure control, adjustment of the mA and/or kV according to
patient size and/or use of iterative reconstruction technique.

CONTRAST:  100mL OMNIPAQUE IOHEXOL 300 MG/ML  SOLN
FINDINGS: Lower chest: Unremarkable.

Hepatobiliary: No suspicious focal abnormality within the liver
parenchyma. There is no evidence for gallstones, gallbladder wall
thickening, or pericholecystic fluid. No intrahepatic or
extrahepatic biliary dilation.

Pancreas: No focal mass lesion. No dilatation of the main duct. No
intraparenchymal cyst. No peripancreatic edema.

Spleen: No splenomegaly. No focal mass lesion.

Adrenals/Urinary Tract: No adrenal nodule or mass. Kidneys
unremarkable. No evidence for hydroureter. The urinary bladder
appears normal for the degree of distention.

Stomach/Bowel: Stomach is unremarkable. No gastric wall thickening.
No evidence of outlet obstruction. Duodenum is normally positioned
as is the ligament of Treitz. Small bowel in the left upper quadrant
and central abdomen is dilated and fluid-filled measuring up to
cm diameter. Fecalization of enteric contents noted in the left
abdominal loop (axial image 49/3) with rapid tapering into a
nondilated loop (axial 47/3) this appears to be in the region of the
distal jejunum. Ileal loops are decompressed. Terminal ileum
unremarkable. The appendix is normal. Colon is diffusely
decompressed.

Vascular/Lymphatic: No abdominal aortic aneurysm. No abdominal
aortic atherosclerotic calcification. There is no gastrohepatic or
hepatoduodenal ligament lymphadenopathy. No retroperitoneal or
mesenteric lymphadenopathy. No pelvic sidewall lymphadenopathy.

Reproductive: The prostate gland and seminal vesicles are
unremarkable.

Other: Multiple well-defined homogeneous collections of loculated
fluid or low-density soft tissue are identified throughout the
peritoneal cavity. 4.2 x 3.1 cm lesion identified in the lesser sac
on image 19/series 3. 4.7 x 1.7 cm collection identified anterior to
the liver on image 11/series 3. 3.7 x 1.8 cm collection identified
anterior to the inferior vena cava on 43/3. Several omental
collections are evident. Material is identified in the left
paracolic gutter (images 33-56 of series 3. Small volume free fluid
noted in the posterior pelvis.

Musculoskeletal: No worrisome lytic or sclerotic osseous
abnormality.
IMPRESSION: 1. Multiple well-defined homogeneous collections of loculated fluid
or low-density soft tissue throughout the peritoneal cavity and in
the anterior right juxta. Imaging features are nonspecific but raise
the question of peritoneal carcinomatosis. PET-CT may prove helpful
to further evaluate. Ultimately, tissue sampling likely warranted.
No appendix is visible on the study in the patient may be status
post appendectomy. If so, correlation with pathology report from
appendectomy suggested.
2. Small bowel obstruction with transition point likely in the
region of the distal jejunum.
3. Small volume free fluid in the posterior pelvis.

## 2023-03-20 IMAGING — DX DG ABD PORTABLE 1V
1 series · 2 of 2 positions shown · non-contrast
Comparison: 06/19/2021

CLINICAL DATA: Small-bowel obstruction

EXAM:
PORTABLE ABDOMEN - 1 VIEW

[Series 1: abdomen · 0.14mm/px · 2 of 2 slices shown]
[im 1/2]
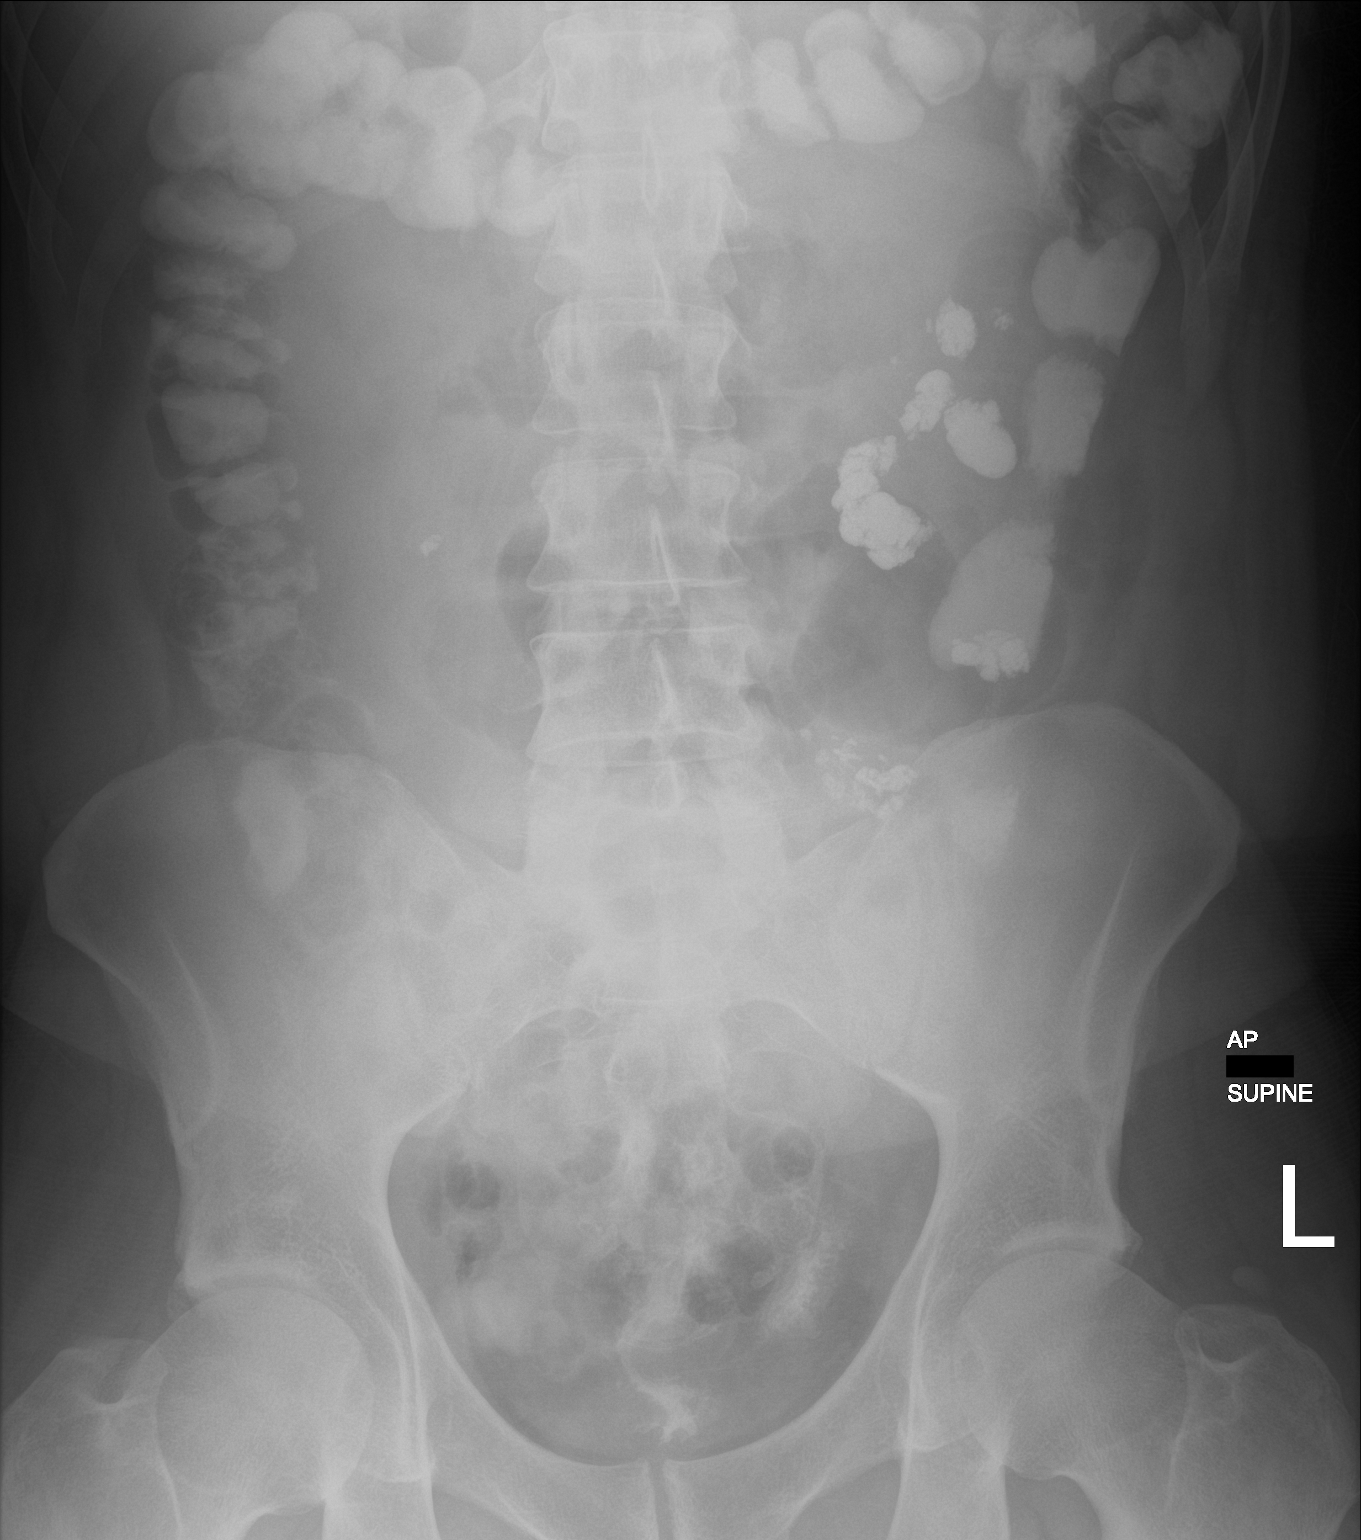
[im 2/2]
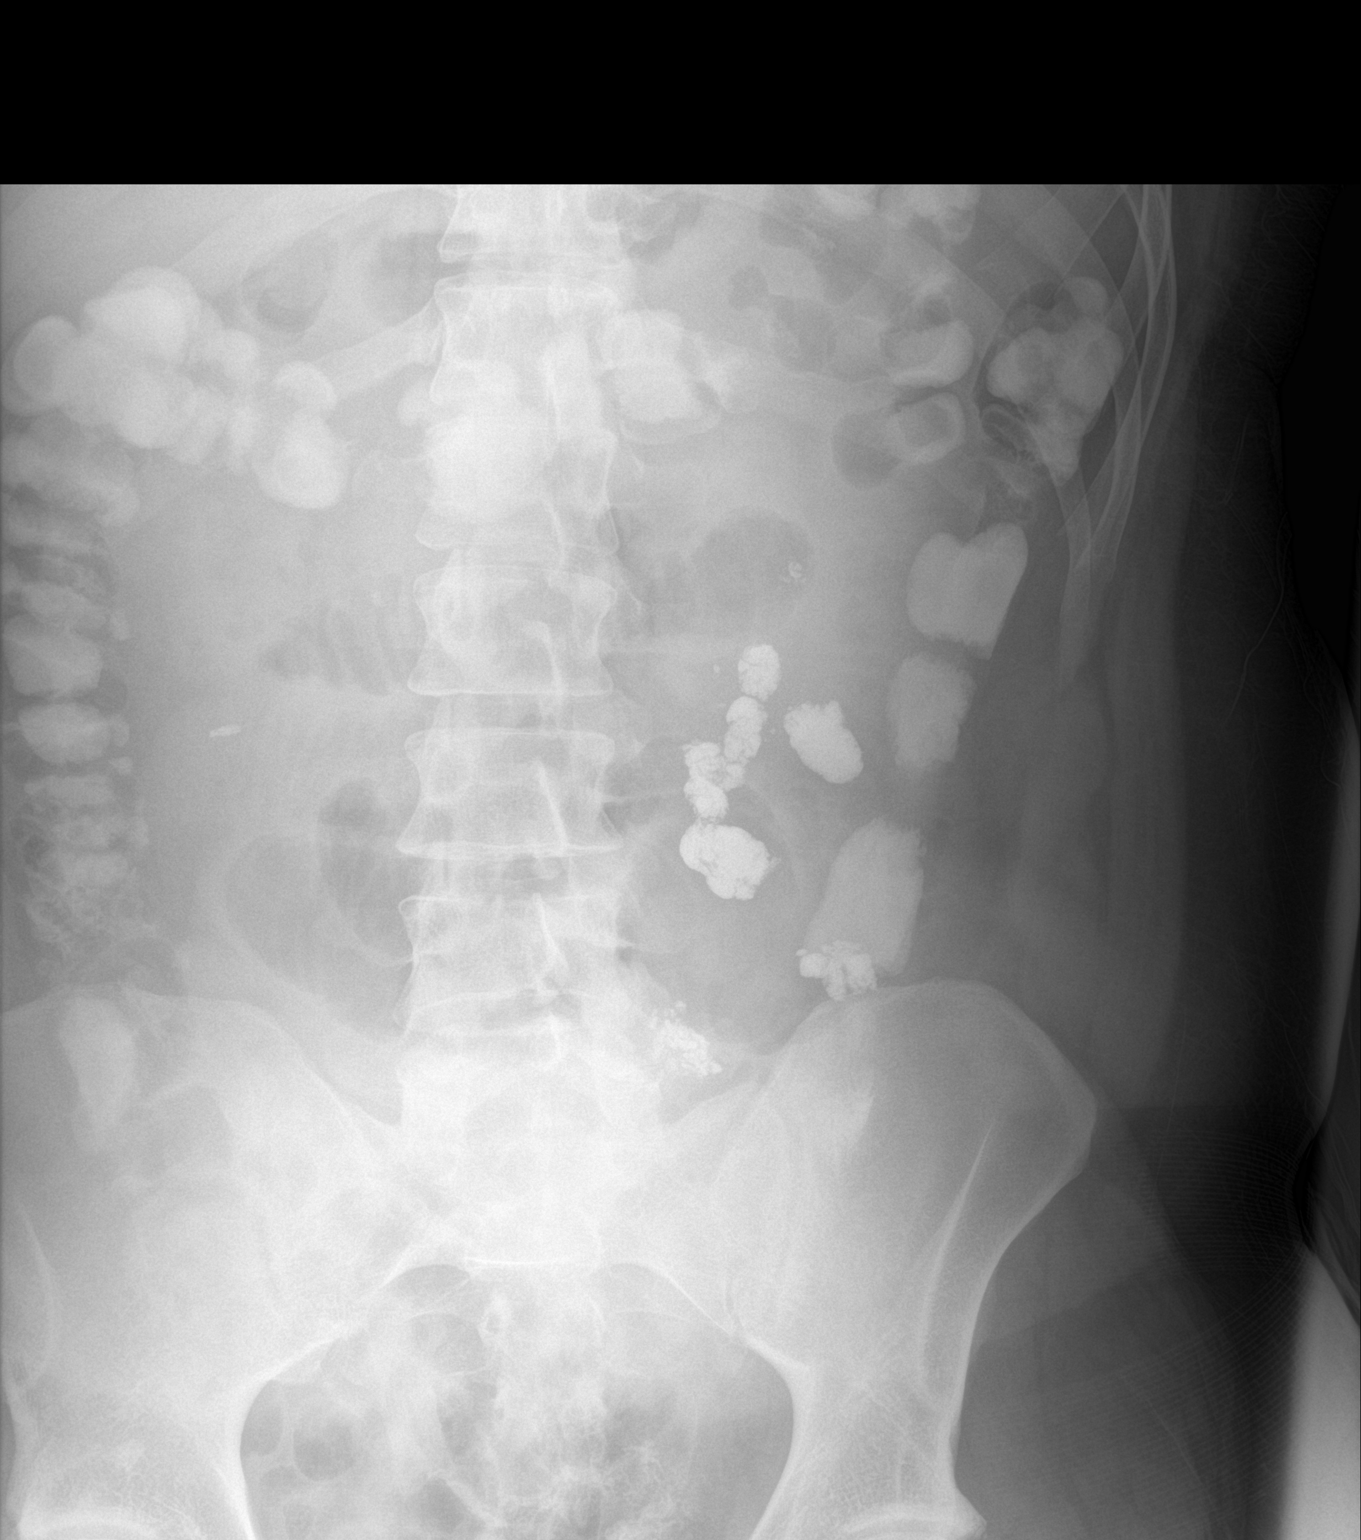

[2 of 2 positions shown; findings below may reference images not displayed]

FINDINGS: Two supine frontal views of the abdomen and pelvis demonstrate oral
contrast throughout the colon. Persistent nonspecific gaseous
distention of the small bowel within the central abdomen, decreasing
caliber since prior CT. No abdominal masses. No acute bony
abnormalities.
IMPRESSION: 1. Transit of oral contrast into the colon, excluding high-grade
obstruction.
2. Nonspecific gaseous distention of the small bowel within the
central abdomen, decreased in caliber since recent CT.

## 2023-03-21 IMAGING — CT CT CHEST W/ CM
2 of 4 series · 15 of 36 positions shown, 18 images · IV contrast (APPLIED)
Comparison: CT abdomen 06/19/2021

CLINICAL DATA: Evaluate for occult malignancy.

EXAM:
CT CHEST WITH CONTRAST
TECHNIQUE: Multidetector CT imaging of the chest was performed during
intravenous contrast administration.

[Series 3: chest w · axial · 0.91mm/px · z∈[+1272,+1624]mm · 12 of 208 slices shown, 15 images]
[im 16/208  mediastinal]
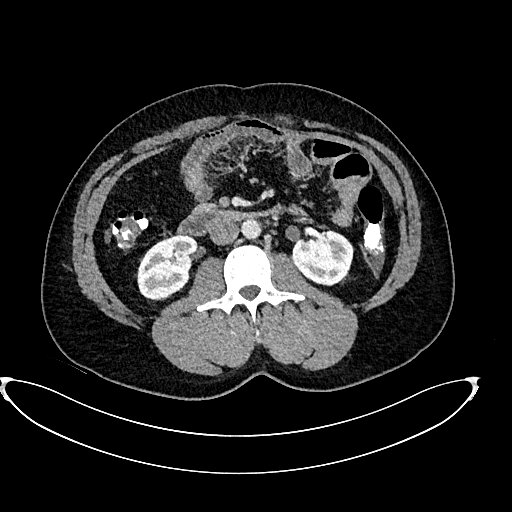
[im 16/208  lung]
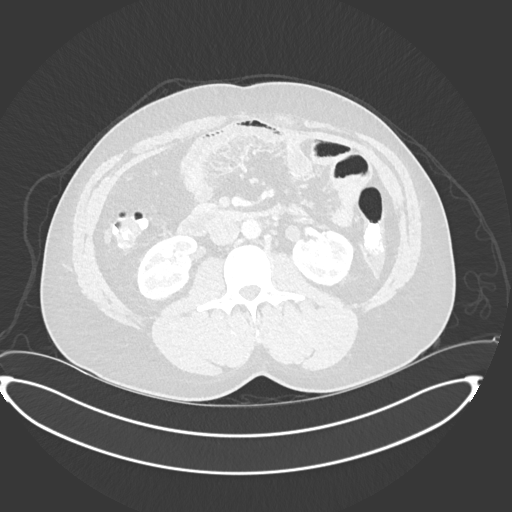
[im 32/208  lung]
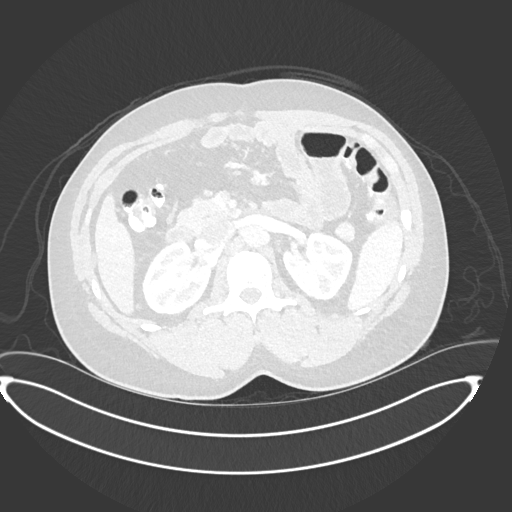
[im 48/208  lung]
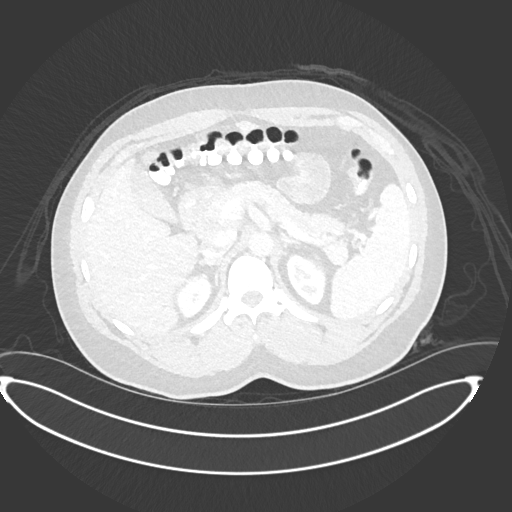
[im 64/208  lung]
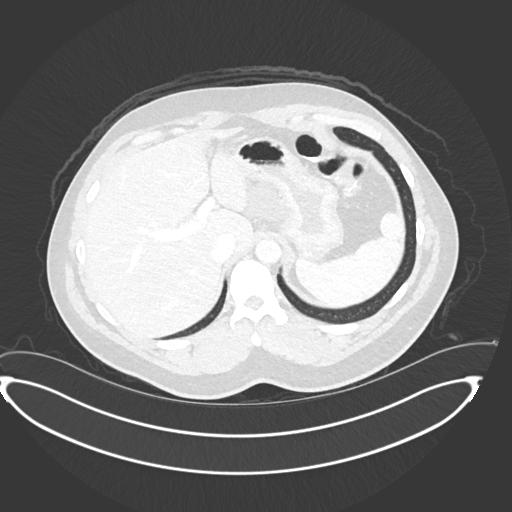
[im 80/208  mediastinal]
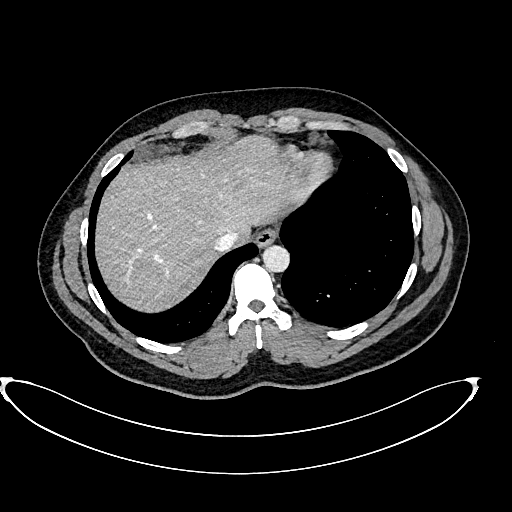
[im 80/208  lung]
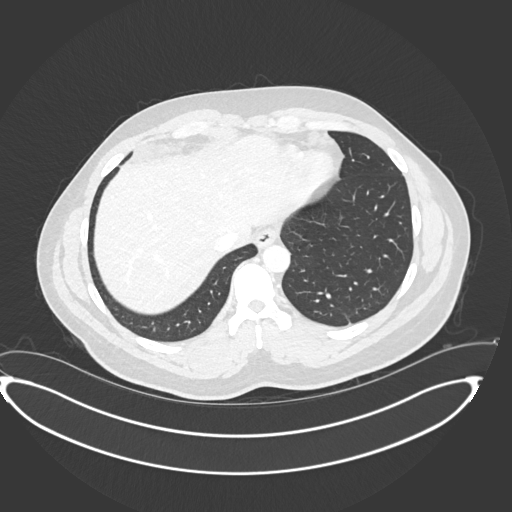
[im 96/208  lung]
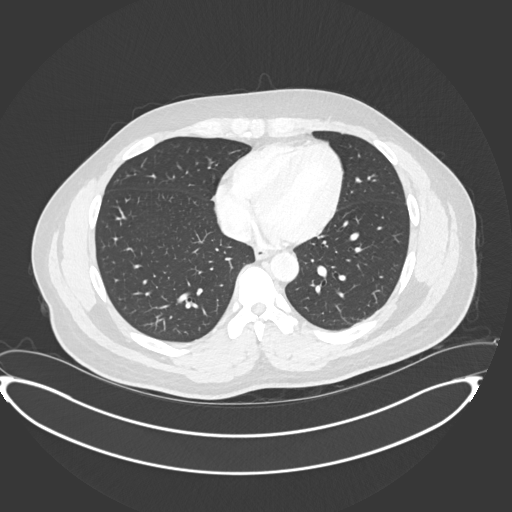
[im 112/208  lung]
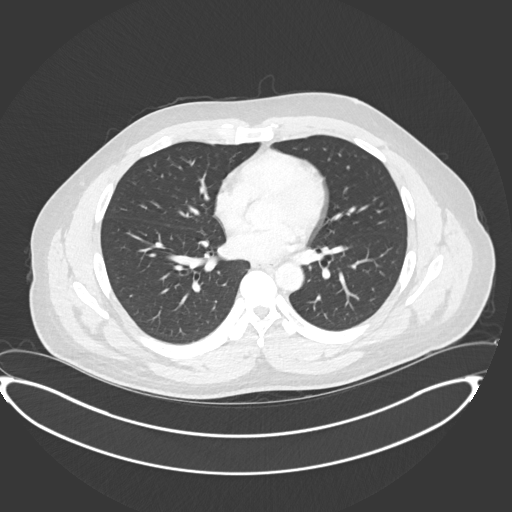
[im 128/208  lung]
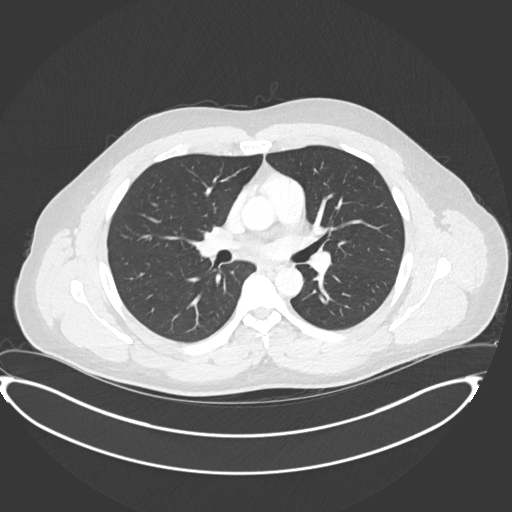
[im 144/208  mediastinal]
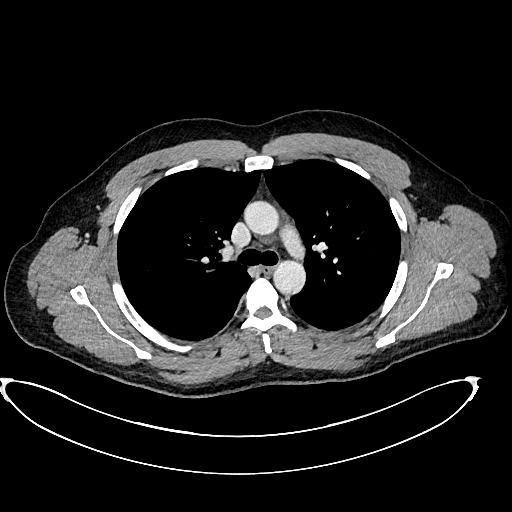
[im 144/208  lung]
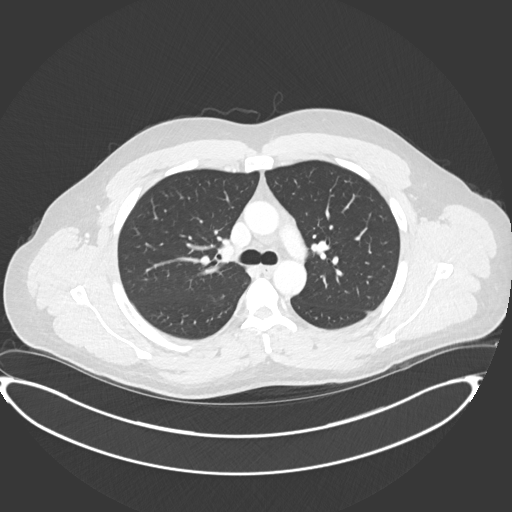
[im 160/208  lung]
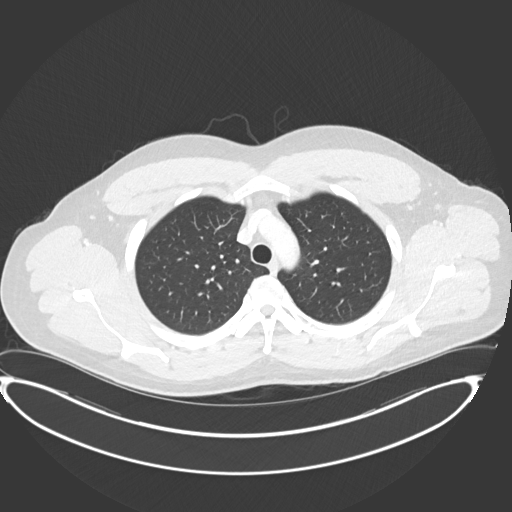
[im 176/208  lung]
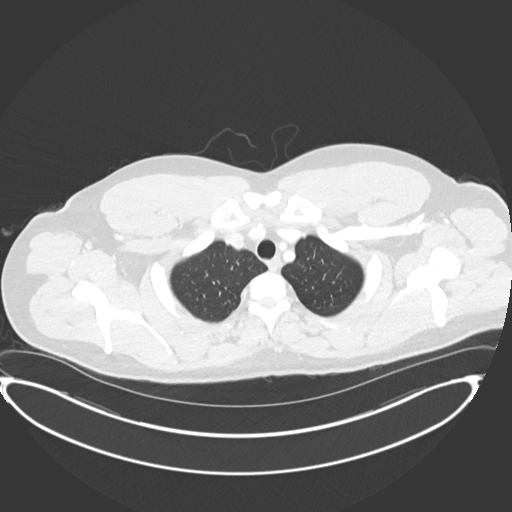
[im 192/208  lung]
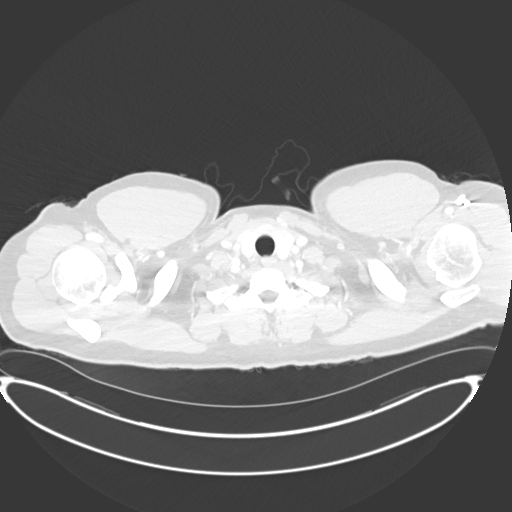

[Series 6: cor · coronal · 0.86mm/px · 3 of 148 slices shown]
[im 30/148  lung]
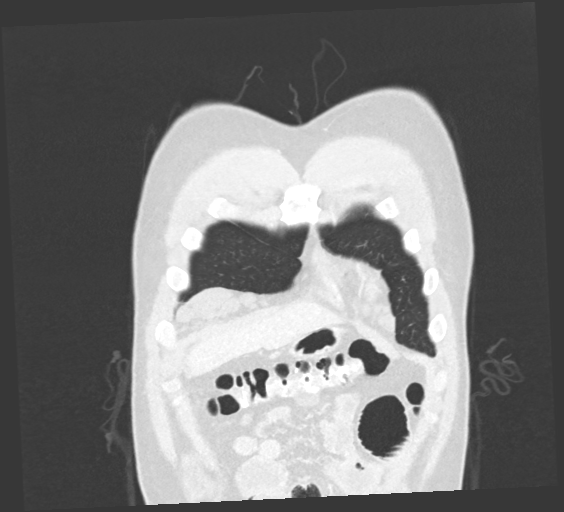
[im 59/148  lung]
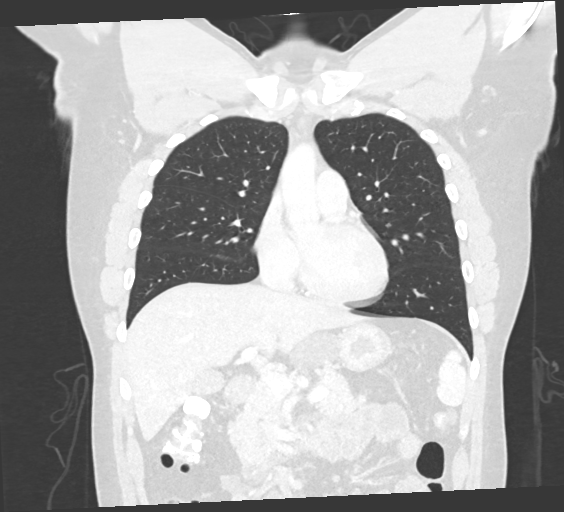
[im 89/148  lung]
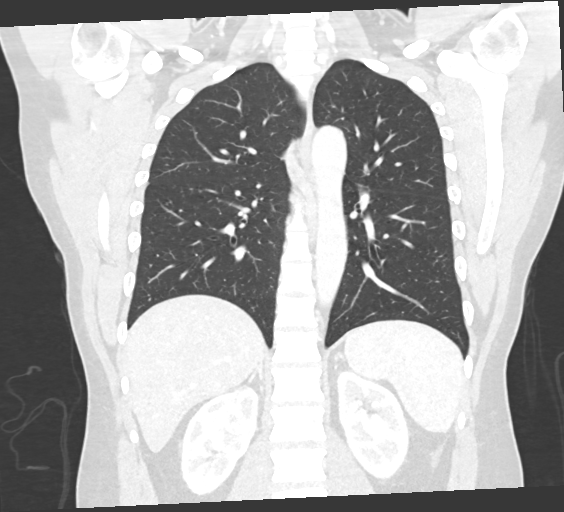

[15 of 36 positions shown; findings below may reference images not displayed]

RADIATION DOSE REDUCTION: This exam was performed according to the
departmental dose-optimization program which includes automated
exposure control, adjustment of the mA and/or kV according to
patient size and/or use of iterative reconstruction technique.

CONTRAST:  75mL OMNIPAQUE IOHEXOL 300 MG/ML  SOLN
FINDINGS: Cardiovascular: Normal caliber of the thoracic aorta. Heart size is
normal. No pericardial effusion.

Mediastinum/Nodes: Again noted are multiple low-density nodular
structures in the anterior cardiophrenic region particularly
anterior to the liver. These are suspicious for low-density soft
tissue nodules. Largest nodule is anterior to the liver and measures
3.7 x 2.2 x 2.2 cm on sequence 3 image 124. Remainder of the
mediastinum appears normal. No hilar lymph node enlargement. No
axillary lymph node enlargement. No enlarged lymph nodes in the
supraclavicular region. Thyroid tissue is unremarkable. Normal
appearance of the esophagus.

Lungs/Pleura: Trachea and mainstem bronchi are patent. No pleural
effusions. No airspace disease or lung consolidation. No suspicious
pulmonary nodules. Few dependent densities in the posterior left
lower lobe are suggestive for atelectasis or mild scarring.

Upper Abdomen: Again noted are low-density nodules or lymph nodes in
the gastrohepatic ligament, largest measures 3.4 cm on sequence 3,
image 147. Small low-density nodules in the right anterior
peritoneal cavity near the colon. Soft tissue nodule in the anterior
abdomen on sequence 3 image 167 measuring 2.5 cm. Small bowel
distension has decreased. Again noted is a prominent soft tissue
nodule just anterior to the IVC on sequence 3 image 204. Normal
appearance of the liver and gallbladder. Abnormal soft tissue along
the medial aspect of the spleen.

Musculoskeletal: No acute bone abnormality. No suspicious osseous
lesions.
IMPRESSION: 1. No suspicious pulmonary nodules. No evidence for a primary lung
neoplasm.
2. Multiple abnormal low-density nodules throughout the abdomen and
in the cardiophrenic spaces. Findings are similar to the recent
abdominal CT and suggestive for metastatic disease.
3. Decreased small bowel distension since 06/19/2021.

## 2023-04-03 DIAGNOSIS — J309 Allergic rhinitis, unspecified: Secondary | ICD-10-CM | POA: Diagnosis not present

## 2023-04-10 DIAGNOSIS — J301 Allergic rhinitis due to pollen: Secondary | ICD-10-CM | POA: Diagnosis not present

## 2023-07-21 DIAGNOSIS — D2271 Melanocytic nevi of right lower limb, including hip: Secondary | ICD-10-CM | POA: Diagnosis not present

## 2023-07-21 DIAGNOSIS — L578 Other skin changes due to chronic exposure to nonionizing radiation: Secondary | ICD-10-CM | POA: Diagnosis not present

## 2023-07-21 DIAGNOSIS — L821 Other seborrheic keratosis: Secondary | ICD-10-CM | POA: Diagnosis not present

## 2023-07-21 DIAGNOSIS — L814 Other melanin hyperpigmentation: Secondary | ICD-10-CM | POA: Diagnosis not present

## 2023-08-11 ENCOUNTER — Ambulatory Visit: Payer: BC Managed Care – PPO | Admitting: Neurology

## 2023-08-11 ENCOUNTER — Encounter: Payer: Self-pay | Admitting: Neurology

## 2023-08-11 VITALS — BP 120/84 | HR 88 | Ht 74.0 in | Wt 244.0 lb

## 2023-08-11 DIAGNOSIS — G4752 REM sleep behavior disorder: Secondary | ICD-10-CM

## 2023-08-11 DIAGNOSIS — G40909 Epilepsy, unspecified, not intractable, without status epilepticus: Secondary | ICD-10-CM

## 2023-08-11 DIAGNOSIS — Z5181 Encounter for therapeutic drug level monitoring: Secondary | ICD-10-CM | POA: Diagnosis not present

## 2023-08-11 MED ORDER — CARBAMAZEPINE ER 400 MG PO TB12
400.0000 mg | ORAL_TABLET | Freq: Two times a day (BID) | ORAL | 3 refills | Status: DC
Start: 2023-08-11 — End: 2024-03-02

## 2023-08-11 NOTE — Patient Instructions (Signed)
 Continue with Tegretol  XR 400 mg twice daily, Brand name only  Will check Tegretol  level, Vitamin D  and BMP  Routine EEG, if normal we will obtain ambulatory EEG  Continue your other medications  Return in 6 months or sooner if worse

## 2023-08-11 NOTE — Progress Notes (Signed)
 [facility]  PATIENT: Kyle Sharp DOB: [birth year]  REQUESTING CLINICIAN: Mordechai April, DO HISTORY FROM: Patient  REASON FOR VISIT: Seizure    HISTORICAL  CHIEF COMPLAINT:  Chief Complaint  Patient presents with   New Patient (Initial Visit)    Pt in 12, here alone  Pt is referred for seizures. Pt states he has been seizure free since 2014. Pt states he's been told by his wife that he "moves" a lot during the night in his sleep.     HISTORY OF PRESENT ILLNESS:  This a 43 year old gentleman past medical history of seizure disorder who is presenting for management of his seizures.  He tells me that his first seizure occurred at the age of 43 or 65.  He remembers at that time training to join [employer], and had a first seizure while in the gym.  He tells me he remembers passing out at the gym and waking up at the doctor's office.  He tells me that he had a full workup and it was negative.  From then he continued to have seizures.  In 2011, he did have MRI brain and ambulatory EEG and workup was negative.  He tells me at that time he was very stressed out, working on his master program and was sleep deprived due to having a small child.  Initially he was on Tegretol  250 mg daily which was later increased up to 400 mg twice daily after his last seizure in 2014.  From then, he tells me he has been having auras like he is going to have a seizure but is able to fight it, auras are described as feeling dizzy, lightheaded, he will go and drink water or put cold water on his face; sometimes will sit in a quiet place place and wait for the episode to pass.    He also reports acting out his dream, wife will wake him up in the middle of the night while he is shaking and moving and he will remember having a dream.  These events are getting more frequent, he denies punching or hurting his wife during sleep.  He is interesting in pursuing a polysomnogram.    Handedness: Right  hand  Onset: At the age of 36   Seizure Type: Passing out   Current frequency: last seizure was 2014  Any injuries from seizures: Fall   Seizure risk factors: Denies   Previous ASMs: Carbamazepine    Currenty ASMs: Carbamazepine  400 mg twice daily   ASMs side effects: None   Brain Images: Reported as normal, not available for review   Previous EEGs: Reported as normal, not available for review    OTHER MEDICAL CONDITIONS: Seizure, seasonal allergies   REVIEW OF SYSTEMS: Full 14 system review of systems performed and negative with exception of: As noted in the HPI   ALLERGIES: No Known Allergies  HOME MEDICATIONS: Outpatient Medications Prior to Visit  Medication Sig Dispense Refill   carbamazepine  (TEGRETOL  XR) 400 MG 12 hr tablet Take 400 mg by mouth 2 (two) times daily.     Amoxicill-Rifabutin-Omeprazole  (TALICIA ) 250-12.5-10 MG CPDR Take 4 capsules by mouth in the morning, at noon, and at bedtime. (Patient not taking: Reported on 12/18/2021) 168 capsule 0   omeprazole  (PRILOSEC) 20 MG capsule Take 1 capsule (20 mg total) by mouth 2 (two) times daily before a meal for 14 days. 28 capsule 0   ondansetron  (ZOFRAN ) 4 MG tablet Take 1 tablet (4 mg total) by mouth  every 8 (eight) hours as needed for nausea or vomiting. 20 tablet 0   polyethylene glycol (MIRALAX / GLYCOLAX) 17 g packet Take 17 g by mouth daily as needed for mild constipation.     sennosides-docusate sodium (SENOKOT-S) 8.6-50 MG tablet Take 1 tablet by mouth daily as needed for constipation.     No facility-administered medications prior to visit.    PAST MEDICAL HISTORY: Past Medical History:  Diagnosis Date   SBO (small bowel obstruction) (HCC) 06/19/2021   Seizure disorder (HCC)    08-13-21- per pt 2014    PAST SURGICAL HISTORY: Past Surgical History:  Procedure Laterality Date   EXPLORATORY LAPAROTOMY     At the age of 1 and at the age of 43    FAMILY HISTORY: Family History  Problem Relation  Age of Onset   Heart failure Mother    Uterine cancer Paternal Aunt    Heart attack Paternal Aunt    Colon cancer Neg Hx    Colon polyps Neg Hx    Esophageal cancer Neg Hx    Rectal cancer Neg Hx    Stomach cancer Neg Hx    Inflammatory bowel disease Neg Hx    Liver disease Neg Hx    Pancreatic cancer Neg Hx     SOCIAL HISTORY: Social History   Socioeconomic History   Marital status: Married    Spouse name: Not on file   Number of children: Not on file   Years of education: Not on file   Highest education level: Not on file  Occupational History   Not on file  Tobacco Use   Smoking status: Never   Smokeless tobacco: Never  Vaping Use   Vaping status: Never Used  Substance and Sexual Activity   Alcohol use: Yes    Comment: Occasional   Drug use: Never   Sexual activity: Yes  Other Topics Concern   Not on file  Social History Narrative   Not on file   Social Drivers of Health   Financial Resource Strain: Not on file  Food Insecurity: Not on file  Transportation Needs: Not on file  Physical Activity: Not on file  Stress: Not on file  Social Connections: Unknown (08/20/2021)   Received from [facility]   Social Network    Social Network: Not on file  Intimate Partner Violence: Unknown (07/27/2021)   Received from [facility], [facility]   HITS    Physically Hurt: Not on file    Insult or Talk Down To: Not on file    Threaten Physical Harm: Not on file    Scream or Curse: Not on file     PHYSICAL EXAM  GENERAL EXAM/CONSTITUTIONAL: Vitals:  Vitals:   08/11/23 0841  BP: 120/84  Pulse: 88  Weight: 244 lb (110.7 kg)  Height: 6\' 2"  (1.88 m)   Body mass index is 31.33 kg/m. Wt Readings from Last 3 Encounters:  08/11/23 244 lb (110.7 kg)  12/18/21 247 lb 9.6 oz (112.3 kg)  11/22/21 242 lb (109.8 kg)   Patient is in no distress; well developed, nourished and groomed; neck is supple  MUSCULOSKELETAL: Gait, strength, tone, movements  noted in Neurologic exam below  NEUROLOGIC: MENTAL STATUS:      No data to display         awake, alert, oriented to person, place and time recent and remote memory intact normal attention and concentration language fluent, comprehension intact, naming intact fund of knowledge appropriate  CRANIAL NERVE:  2nd, 3rd, 4th, 6th - Visual fields full to confrontation, extraocular muscles intact, no nystagmus 5th - facial sensation symmetric 7th - facial strength symmetric 8th - hearing intact 9th - palate elevates symmetrically, uvula midline 11th - shoulder shrug symmetric 12th - tongue protrusion midline  MOTOR:  normal bulk and tone, full strength in the BUE, BLE  SENSORY:  normal and symmetric to light touch  COORDINATION:  finger-nose-finger, fine finger movements normal  GAIT/STATION:  normal   DIAGNOSTIC DATA (LABS, IMAGING, TESTING) - I reviewed patient records, labs, notes, testing and imaging myself where available.  Lab Results  Component Value Date   WBC 4.0 11/22/2021   HGB 13.5 11/22/2021   HCT 38.9 (L) 11/22/2021   MCV 80.9 11/22/2021   PLT 191 11/22/2021      Component Value Date/Time   NA 137 11/22/2021 0819   K 4.0 11/22/2021 0819   CL 104 11/22/2021 0819   CO2 25 11/22/2021 0819   GLUCOSE 89 11/22/2021 0819   BUN 18 11/22/2021 0819   CREATININE 1.16 11/22/2021 0819   CALCIUM 9.0 11/22/2021 0819   PROT 8.1 11/22/2021 0819   ALBUMIN 4.5 11/22/2021 0819   AST 36 11/22/2021 0819   ALT 35 11/22/2021 0819   ALKPHOS 92 11/22/2021 0819   BILITOT 0.3 11/22/2021 0819   GFRNONAA >60 11/22/2021 0819   No results found for: "CHOL", "HDL", "LDLCALC", "LDLDIRECT", "TRIG" No results found for: "HGBA1C" No results found for: "VITAMINB12" No results found for: "TSH"   ASSESSMENT AND PLAN  43 y.o. year old male  with history of seizure who is presenting for management of his seizure disorder.  He is on Tegretol  XR, brand-name, 400 mg twice daily,  and his last seizure was reported 2014.  So far, workup including brain MRI, ambulatory EEG has been normal.  He describes the seizure as passing out and auras as dizziness, feeling like he is going to have a seizure but if he sit down or drinks water, or put some cold water in his face the episodes will pass.  Vasovagal syncope is also a consideration.   I have told patient that since he has not had a seizure for more than 10 years, his seizure disorder is in remission, we will plan to obtain EEGs and if normal we can decrease the medication with the plan to stop.   In terms of his report of acting out his dreams, will discuss with sleep neurology to see for polysomnogram is warranted at the moment.  I will contact him to give him update of his test result.  I will see him in 6 months for follow-up, and at that time if EEG has been normal we will decide to decrease medication.  Advised him to contact me if he does have any additional question or concerns.   1. Seizure disorder (HCC)   2. Therapeutic drug monitoring   3. Dream enactment behavior     Patient Instructions  Continue with Tegretol  XR 400 mg twice daily, Brand name only  Will check Tegretol  level, Vitamin D  and BMP  Routine EEG, if normal we will obtain ambulatory EEG  Continue your other medications  Return in 6 months or sooner if worse    Per [place]  DMV statutes, patients with seizures are not allowed to drive until they have been seizure-free for six months.  Other recommendations include using caution when using heavy equipment or power tools. Avoid working on ladders or at heights.  Take showers instead of baths.  Do not swim alone.  Ensure the water temperature is not too high on the home water heater. Do not go swimming alone. Do not lock yourself in a room alone (i.e. bathroom). When caring for infants or small children, sit down when holding, feeding, or changing them to minimize risk of injury to the child in the  event you have a seizure. Maintain good sleep hygiene. Avoid alcohol.  Also recommend adequate sleep, hydration, good diet and minimize stress.   During the Seizure  - First, ensure adequate ventilation and place patients on the floor on their left side  Loosen clothing around the neck and ensure the airway is patent. If the patient is clenching the teeth, do not force the mouth open with any object as this can cause severe damage - Remove all items from the surrounding that can be hazardous. The patient may be oblivious to what's happening and may not even know what he or she is doing. If the patient is confused and wandering, either gently guide him/her away and block access to outside areas - Reassure the individual and be comforting - Call 911. In most cases, the seizure ends before EMS arrives. However, there are cases when seizures may last over 3 to 5 minutes. Or the individual may have developed breathing difficulties or severe injuries. If a pregnant patient or a person with diabetes develops a seizure, it is prudent to call an ambulance. - Finally, if the patient does not regain full consciousness, then call EMS. Most patients will remain confused for about 45 to 90 minutes after a seizure, so you must use judgment in calling for help. - Avoid restraints but make sure the patient is in a bed with padded side rails - Place the individual in a lateral position with the neck slightly flexed; this will help the saliva drain from the mouth and prevent the tongue from falling backward - Remove all nearby furniture and other hazards from the area - Provide verbal assurance as the individual is regaining consciousness - Provide the patient with privacy if possible - Call for help and start treatment as ordered by the caregiver   After the Seizure (Postictal Stage)  After a seizure, most patients experience confusion, fatigue, muscle pain and/or a headache. Thus, one should permit the  individual to sleep. For the next few days, reassurance is essential. Being calm and helping reorient the person is also of importance.  Most seizures are painless and end spontaneously. Seizures are not harmful to others but can lead to complications such as stress on the lungs, brain and the heart. Individuals with prior lung problems may develop labored breathing and respiratory distress.    Discussed Patients with epilepsy have a small risk of sudden unexpected death, a condition referred to as sudden unexpected death in epilepsy (SUDEP). SUDEP is defined specifically as the sudden, unexpected, witnessed or unwitnessed, nontraumatic and nondrowning death in patients with epilepsy with or without evidence for a seizure, and excluding documented status epilepticus, in which post mortem examination does not reveal a structural or toxicologic cause for death     Orders Placed This Encounter  Procedures   Carbamazepine  level, total   Vitamin D , 25-hydroxy   Basic Metabolic Panel   EEG adult    Meds ordered this encounter  Medications   carbamazepine  (TEGRETOL -XR) 400 MG 12 hr tablet    Sig: Take 1 tablet (400 mg total) by mouth 2 (two) times daily.  Dispense:  180 tablet    Refill:  3    BRAND NAME ONLY    Return in about 6 months (around 02/10/2024).    Cassandra Cleveland, MD 08/11/2023, 10:01 AM  [facility] [address], [address] [place], [place] [address] [phone]

## 2023-08-12 ENCOUNTER — Encounter: Payer: Self-pay | Admitting: Neurology

## 2023-08-12 ENCOUNTER — Other Ambulatory Visit: Payer: Self-pay | Admitting: Neurology

## 2023-08-12 LAB — BASIC METABOLIC PANEL WITH GFR
BUN/Creatinine Ratio: 15 (ref 9–20)
BUN: 17 mg/dL (ref 6–24)
CO2: 22 mmol/L (ref 20–29)
Calcium: 9.3 mg/dL (ref 8.7–10.2)
Chloride: 100 mmol/L (ref 96–106)
Creatinine, Ser: 1.17 mg/dL (ref 0.76–1.27)
Glucose: 89 mg/dL (ref 70–99)
Potassium: 4.5 mmol/L (ref 3.5–5.2)
Sodium: 136 mmol/L (ref 134–144)
eGFR: 79 mL/min/{1.73_m2} (ref >59)

## 2023-08-12 LAB — CARBAMAZEPINE LEVEL, TOTAL: Carbamazepine (Tegretol), S: 10.8 ug/mL (ref 4.0–12.0)

## 2023-08-12 LAB — VITAMIN D 25 HYDROXY (VIT D DEFICIENCY, FRACTURES): Vit D, 25-Hydroxy: 16 ng/mL — ABNORMAL LOW (ref 30.0–100.0)

## 2023-08-12 MED ORDER — VITAMIN D (ERGOCALCIFEROL) 1.25 MG (50000 UNIT) PO CAPS: 50000.0000 [IU] | ORAL_CAPSULE | ORAL | 0 refills | Status: DC

## 2023-08-13 ENCOUNTER — Ambulatory Visit: Admitting: *Deleted

## 2023-08-13 DIAGNOSIS — G40909 Epilepsy, unspecified, not intractable, without status epilepticus: Secondary | ICD-10-CM

## 2023-08-13 NOTE — Procedures (Signed)
   History:  43 year old man with history of seizure   EEG classification:  Awake and asleep  Duration: 26 minutes   Technical aspects: This EEG study was done with scalp electrodes positioned according to the 10-20 International system of electrode placement. Electrical activity was reviewed with band pass filter of 1-70Hz , sensitivity of 7 uV/mm, display speed of 63mm/sec with a 60Hz  notched filter applied as appropriate. EEG data were recorded continuously and digitally stored.   Description of the recording: The background rhythms of this recording consists of a fairly well modulated medium amplitude background activity of 10 Hz. As the record progresses, the patient initially is in the waking state, but appears to enter the early stage II sleep during the recording, with rudimentary sleep spindles and vertex sharp wave activity seen. During the wakeful state, photic stimulation was performed, and no abnormal responses were seen. Hyperventilation was also performed, no abnormal response seen. No epileptiform discharges seen during this recording. There was no focal slowing.   Abnormality: None   Impression: This is a normal awake and sleep EEG. No evidence of interictal epileptiform discharges. Normal EEGs, however, do not rule out epilepsy.    Lashara Urey, MD [facility]

## 2023-08-14 ENCOUNTER — Other Ambulatory Visit: Payer: Self-pay | Admitting: Neurology

## 2023-08-14 ENCOUNTER — Telehealth: Payer: Self-pay

## 2023-08-14 ENCOUNTER — Encounter: Payer: Self-pay | Admitting: Neurology

## 2023-08-14 DIAGNOSIS — G40909 Epilepsy, unspecified, not intractable, without status epilepticus: Secondary | ICD-10-CM

## 2023-08-14 NOTE — Addendum Note (Signed)
 Addended byCassandra Cleveland on: 08/14/2023 02:56 PM   Modules accepted: Orders

## 2024-02-04 DIAGNOSIS — L821 Other seborrheic keratosis: Secondary | ICD-10-CM | POA: Diagnosis not present

## 2024-02-04 DIAGNOSIS — Z0000 Encounter for general adult medical examination without abnormal findings: Secondary | ICD-10-CM | POA: Diagnosis not present

## 2024-02-04 DIAGNOSIS — E7800 Pure hypercholesterolemia, unspecified: Secondary | ICD-10-CM | POA: Diagnosis not present

## 2024-02-04 DIAGNOSIS — G40909 Epilepsy, unspecified, not intractable, without status epilepticus: Secondary | ICD-10-CM | POA: Diagnosis not present

## 2024-02-04 DIAGNOSIS — Z1159 Encounter for screening for other viral diseases: Secondary | ICD-10-CM | POA: Diagnosis not present

## 2024-02-13 ENCOUNTER — Other Ambulatory Visit: Payer: Self-pay | Admitting: Family Medicine

## 2024-02-13 DIAGNOSIS — R935 Abnormal findings on diagnostic imaging of other abdominal regions, including retroperitoneum: Secondary | ICD-10-CM

## 2024-02-20 ENCOUNTER — Ambulatory Visit
Admission: RE | Admit: 2024-02-20 | Discharge: 2024-02-20 | Disposition: A | Discharge status: Home / Self Care | Source: Ambulatory Visit | Attending: Family Medicine | Admitting: Family Medicine

## 2024-02-20 DIAGNOSIS — R935 Abnormal findings on diagnostic imaging of other abdominal regions, including retroperitoneum: Secondary | ICD-10-CM

## 2024-02-20 DIAGNOSIS — K668 Other specified disorders of peritoneum: Secondary | ICD-10-CM | POA: Diagnosis not present

## 2024-02-20 MED ORDER — IOPAMIDOL (ISOVUE-300) INJECTION 61%
100.0000 mL | Freq: Once | INTRAVENOUS | Status: AC | PRN
  Administered 2024-02-20: 100 mL via INTRAVENOUS

## 2024-03-02 ENCOUNTER — Ambulatory Visit (INDEPENDENT_AMBULATORY_CARE_PROVIDER_SITE_OTHER): Admitting: Neurology

## 2024-03-02 ENCOUNTER — Encounter: Payer: Self-pay | Admitting: Neurology

## 2024-03-02 VITALS — BP 140/93 | HR 77 | Ht 74.0 in | Wt 249.5 lb

## 2024-03-02 DIAGNOSIS — E559 Vitamin D deficiency, unspecified: Secondary | ICD-10-CM | POA: Diagnosis not present

## 2024-03-02 DIAGNOSIS — Z5181 Encounter for therapeutic drug level monitoring: Secondary | ICD-10-CM

## 2024-03-02 DIAGNOSIS — G40909 Epilepsy, unspecified, not intractable, without status epilepticus: Secondary | ICD-10-CM | POA: Diagnosis not present

## 2024-03-02 MED ORDER — CARBAMAZEPINE ER 400 MG PO TB12: 400.0000 mg | ORAL_TABLET | Freq: Two times a day (BID) | ORAL | 3 refills | Status: AC

## 2024-03-02 NOTE — Patient Instructions (Signed)
 Continue with brand-name Tegretol -XR 400 mg twice daily for now Will try and obtain a 3-day ambulatory EEG and if normal plan will be to reduce the Tegretol  to once a day Follow-up in 1 year or sooner if worse

## 2024-03-02 NOTE — Progress Notes (Signed)
 [facility]  PATIENT: Kyle Sharp DOB: September 27, 1980  REQUESTING CLINICIAN: No ref. provider found HISTORY FROM: Patient  REASON FOR VISIT: Seizure    HISTORICAL  CHIEF COMPLAINT:  Chief Complaint  Patient presents with   Follow-up    Pt in room 12. Alone. Here for seizure follow up.    INTERVAL HISTORY 03/02/2024 Kyle Sharp presents today for follow-up, last visit was in April.  At that time obtain routine EEG which was normal but her co-pay for the ambulatory EEG was too expensive therefore it was postponed.  He continues on brand-name Tegretol  XR 400 mg twice daily, no side effects and no additional seizure.   HISTORY OF PRESENT ILLNESS:  This a 43 year old gentleman past medical history of seizure disorder who is presenting for management of his seizures.  He tells me that his first seizure occurred at the age of 43 or 49.  He remembers at that time training to join [employer], and had a first seizure while in the gym.  He tells me he remembers passing out at the gym and waking up at the doctor's office.  He tells me that he had a full workup and it was negative.  From then he continued to have seizures.  In 2011, he did have MRI brain and ambulatory EEG and workup was negative.  He tells me at that time he was very stressed out, working on his master program and was sleep deprived due to having a small child.  Initially he was on Tegretol  250 mg daily which was later increased up to 400 mg twice daily after his last seizure in 2014.  From then, he tells me he has been having auras like he is going to have a seizure but is able to fight it, auras are described as feeling dizzy, lightheaded, he will go and drink water or put cold water on his face; sometimes will sit in a quiet place place and wait for the episode to pass.    He also reports acting out his dream, wife will wake him up in the middle of the night while he is shaking and moving and he will remember  having a dream.  These events are getting more frequent, he denies punching or hurting his wife during sleep.  He is interesting in pursuing a polysomnogram.    Handedness: Right hand  Onset: At the age of 43   Seizure Type: Passing out   Current frequency: last seizure was 2014  Any injuries from seizures: Fall   Seizure risk factors: Denies   Previous ASMs: Carbamazepine    Currenty ASMs: Carbamazepine  XR 400 mg twice daily   ASMs side effects: None   Brain Images: Reported as normal, not available for review   Previous EEGs: Reported as normal, not available for review    OTHER MEDICAL CONDITIONS: Seizure, seasonal allergies   REVIEW OF SYSTEMS: Full 14 system review of systems performed and negative with exception of: As noted in the HPI   ALLERGIES: No Known Allergies  HOME MEDICATIONS: Outpatient Medications Prior to Visit  Medication Sig Dispense Refill   Vitamin D , Ergocalciferol , (DRISDOL ) 1.25 MG (50000 UNIT) CAPS capsule Take 1 capsule (50,000 Units total) by mouth every 7 (seven) days. 12 capsule 0   carbamazepine  (TEGRETOL -XR) 400 MG 12 hr tablet Take 1 tablet (400 mg total) by mouth 2 (two) times daily. 180 tablet 3   No facility-administered medications prior to visit.    PAST MEDICAL HISTORY: Past Medical  History:  Diagnosis Date   SBO (small bowel obstruction) (HCC) 06/19/2021   Seizure disorder (HCC)    08-13-21- per pt 2014    PAST SURGICAL HISTORY: Past Surgical History:  Procedure Laterality Date   EXPLORATORY LAPAROTOMY     At the age of 1 and at the age of 14    FAMILY HISTORY: Family History  Problem Relation Age of Onset   Heart failure Mother    Uterine cancer Paternal Aunt    Heart attack Paternal Aunt    Colon cancer Neg Hx    Colon polyps Neg Hx    Esophageal cancer Neg Hx    Rectal cancer Neg Hx    Stomach cancer Neg Hx    Inflammatory bowel disease Neg Hx    Liver disease Neg Hx    Pancreatic cancer Neg Hx      SOCIAL HISTORY: Social History   Socioeconomic History   Marital status: Married    Spouse name: Not on file   Number of children: Not on file   Years of education: Not on file   Highest education level: Not on file  Occupational History   Not on file  Tobacco Use   Smoking status: Never   Smokeless tobacco: Never  Vaping Use   Vaping status: Never Used  Substance and Sexual Activity   Alcohol use: Yes    Comment: Occasional   Drug use: Never   Sexual activity: Yes  Other Topics Concern   Not on file  Social History Narrative   Not on file   Social Drivers of Health   Financial Resource Strain: Not on file  Food Insecurity: Not on file  Transportation Needs: Not on file  Physical Activity: Not on file  Stress: Not on file  Social Connections: Unknown (08/20/2021)   Received from [facility]   Social Network    Social Network: Not on file  Intimate Partner Violence: Unknown (07/27/2021)   Received from [facility]   HITS    Physically Hurt: Not on file    Insult or Talk Down To: Not on file    Threaten Physical Harm: Not on file    Scream or Curse: Not on file     PHYSICAL EXAM  GENERAL EXAM/CONSTITUTIONAL: Vitals:  Vitals:   03/02/24 0920  BP: (!) 140/93  Pulse: 77  SpO2: 98%  Weight: 249 lb 8 oz (113.2 kg)  Height: 6' 2 (1.88 m)   Body mass index is 32.03 kg/m. Wt Readings from Last 3 Encounters:  03/02/24 249 lb 8 oz (113.2 kg)  08/11/23 244 lb (110.7 kg)  12/18/21 247 lb 9.6 oz (112.3 kg)   Patient is in no distress; well developed, nourished and groomed; neck is supple  MUSCULOSKELETAL: Gait, strength, tone, movements noted in Neurologic exam below  NEUROLOGIC: MENTAL STATUS:      No data to display         awake, alert, oriented to person, place and time recent and remote memory intact normal attention and concentration language fluent, comprehension intact, naming intact fund of knowledge appropriate  CRANIAL NERVE:   2nd, 3rd, 4th, 6th - Visual fields full to confrontation, extraocular muscles intact, no nystagmus 5th - facial sensation symmetric 7th - facial strength symmetric 8th - hearing intact 9th - palate elevates symmetrically, uvula midline 11th - shoulder shrug symmetric 12th - tongue protrusion midline  MOTOR:  normal bulk and tone, full strength in the BUE, BLE  SENSORY:  normal and symmetric  to light touch  COORDINATION:  finger-nose-finger, fine finger movements normal  GAIT/STATION:  normal   DIAGNOSTIC DATA (LABS, IMAGING, TESTING) - I reviewed patient records, labs, notes, testing and imaging myself where available.  Lab Results  Component Value Date   WBC 4.0 11/22/2021   HGB 13.5 11/22/2021   HCT 38.9 (L) 11/22/2021   MCV 80.9 11/22/2021   PLT 191 11/22/2021      Component Value Date/Time   NA 136 08/11/2023 0926   K 4.5 08/11/2023 0926   CL 100 08/11/2023 0926   CO2 22 08/11/2023 0926   GLUCOSE 89 08/11/2023 0926   GLUCOSE 89 11/22/2021 0819   BUN 17 08/11/2023 0926   CREATININE 1.17 08/11/2023 0926   CREATININE 1.16 11/22/2021 0819   CALCIUM 9.3 08/11/2023 0926   PROT 8.1 11/22/2021 0819   ALBUMIN 4.5 11/22/2021 0819   AST 36 11/22/2021 0819   ALT 35 11/22/2021 0819   ALKPHOS 92 11/22/2021 0819   BILITOT 0.3 11/22/2021 0819   GFRNONAA >60 11/22/2021 0819   No results found for: CHOL, HDL, LDLCALC, LDLDIRECT, TRIG No results found for: HGBA1C No results found for: VITAMINB12 No results found for: TSH  Routine EEG 08/13/2023 Normal    ASSESSMENT AND PLAN  43 y.o. year old male  with history of seizure who is presenting for management of his seizure disorder.  He is on Tegretol  XR, brand-name, 400 mg twice daily, and his last seizure was reported 2014.  So far, workup including brain MRI, ambulatory EEG has been normal.  He describes the seizure as passing out and auras as dizziness, feeling like he is going to have a seizure but if  he sit down or drinks water, or put some cold water in his face the episodes will pass.  Vasovagal syncope is also a consideration.   I have told patient that since he has not had a seizure for more than 10 years, his seizure disorder is in remission, we will plan to obtain an ambulatory EEGs and if normal we can decrease the medication with the plan to stop.    1. Seizure disorder (HCC)   2. Therapeutic drug monitoring   3. Vitamin D  deficiency      Patient Instructions  Continue with brand-name Tegretol -XR 400 mg twice daily for now Will try and obtain a 3-day ambulatory EEG and if normal plan will be to reduce the Tegretol  to once a day Follow-up in 1 year or sooner if worse   Per [place]  DMV statutes, patients with seizures are not allowed to drive until they have been seizure-free for six months.  Other recommendations include using caution when using heavy equipment or power tools. Avoid working on ladders or at heights. Take showers instead of baths.  Do not swim alone.  Ensure the water temperature is not too high on the home water heater. Do not go swimming alone. Do not lock yourself in a room alone (i.e. bathroom). When caring for infants or small children, sit down when holding, feeding, or changing them to minimize risk of injury to the child in the event you have a seizure. Maintain good sleep hygiene. Avoid alcohol.  Also recommend adequate sleep, hydration, good diet and minimize stress.   During the Seizure  - First, ensure adequate ventilation and place patients on the floor on their left side  Loosen clothing around the neck and ensure the airway is patent. If the patient is clenching the teeth, do not force the  mouth open with any object as this can cause severe damage - Remove all items from the surrounding that can be hazardous. The patient may be oblivious to what's happening and may not even know what he or she is doing. If the patient is confused and wandering,  either gently guide him/her away and block access to outside areas - Reassure the individual and be comforting - Call 911. In most cases, the seizure ends before EMS arrives. However, there are cases when seizures may last over 3 to 5 minutes. Or the individual may have developed breathing difficulties or severe injuries. If a pregnant patient or a person with diabetes develops a seizure, it is prudent to call an ambulance. - Finally, if the patient does not regain full consciousness, then call EMS. Most patients will remain confused for about 45 to 90 minutes after a seizure, so you must use judgment in calling for help. - Avoid restraints but make sure the patient is in a bed with padded side rails - Place the individual in a lateral position with the neck slightly flexed; this will help the saliva drain from the mouth and prevent the tongue from falling backward - Remove all nearby furniture and other hazards from the area - Provide verbal assurance as the individual is regaining consciousness - Provide the patient with privacy if possible - Call for help and start treatment as ordered by the caregiver   After the Seizure (Postictal Stage)  After a seizure, most patients experience confusion, fatigue, muscle pain and/or a headache. Thus, one should permit the individual to sleep. For the next few days, reassurance is essential. Being calm and helping reorient the person is also of importance.  Most seizures are painless and end spontaneously. Seizures are not harmful to others but can lead to complications such as stress on the lungs, brain and the heart. Individuals with prior lung problems may develop labored breathing and respiratory distress.    Discussed Patients with epilepsy have a small risk of sudden unexpected death, a condition referred to as sudden unexpected death in epilepsy (SUDEP). SUDEP is defined specifically as the sudden, unexpected, witnessed or unwitnessed, nontraumatic and  nondrowning death in patients with epilepsy with or without evidence for a seizure, and excluding documented status epilepticus, in which post mortem examination does not reveal a structural or toxicologic cause for death     Orders Placed This Encounter  Procedures   Carbamazepine  level, total   Basic Metabolic Panel   Vitamin D , 25-hydroxy    Meds ordered this encounter  Medications   carbamazepine  (TEGRETOL -XR) 400 MG 12 hr tablet    Sig: Take 1 tablet (400 mg total) by mouth 2 (two) times daily.    Dispense:  180 tablet    Refill:  3    BRAND NAME ONLY    Return in about 1 year (around 03/02/2025).    Pastor Falling, MD 03/02/2024, 10:43 AM  [facility] [address] [place], [place] [address] [phone]

## 2024-03-15 ENCOUNTER — Encounter: Payer: Self-pay | Admitting: Oncology

## 2024-03-26 DIAGNOSIS — L820 Inflamed seborrheic keratosis: Secondary | ICD-10-CM | POA: Diagnosis not present

## 2024-03-26 DIAGNOSIS — D649 Anemia, unspecified: Secondary | ICD-10-CM | POA: Diagnosis not present

## 2024-03-26 DIAGNOSIS — L290 Pruritus ani: Secondary | ICD-10-CM | POA: Diagnosis not present

## 2024-03-26 DIAGNOSIS — L859 Epidermal thickening, unspecified: Secondary | ICD-10-CM | POA: Diagnosis not present

## 2024-03-26 DIAGNOSIS — R55 Syncope and collapse: Secondary | ICD-10-CM | POA: Diagnosis not present

## 2024-04-02 ENCOUNTER — Other Ambulatory Visit: Payer: Self-pay | Admitting: Neurology

## 2024-04-19 ENCOUNTER — Encounter: Payer: Self-pay | Admitting: *Deleted

## 2024-05-11 ENCOUNTER — Other Ambulatory Visit: Payer: Self-pay | Admitting: Neurology

## 2025-03-08 ENCOUNTER — Ambulatory Visit: Admitting: Neurology
